# Patient Record
Sex: Female | Born: 1935 | Race: White | Hispanic: No | Smoking: Former smoker
Health system: Southern US, Community
[De-identification: ages and names within clinical notes are randomized; demographics above are authoritative.]

## PROBLEM LIST (undated history)

## (undated) DIAGNOSIS — I499 Cardiac arrhythmia, unspecified: Secondary | ICD-10-CM

## (undated) DIAGNOSIS — G4730 Sleep apnea, unspecified: Secondary | ICD-10-CM

## (undated) DIAGNOSIS — R112 Nausea with vomiting, unspecified: Secondary | ICD-10-CM

## (undated) DIAGNOSIS — T4145XA Adverse effect of unspecified anesthetic, initial encounter: Secondary | ICD-10-CM

## (undated) DIAGNOSIS — C50919 Malignant neoplasm of unspecified site of unspecified female breast: Secondary | ICD-10-CM

## (undated) DIAGNOSIS — T8859XA Other complications of anesthesia, initial encounter: Secondary | ICD-10-CM

## (undated) DIAGNOSIS — T753XXA Motion sickness, initial encounter: Secondary | ICD-10-CM

## (undated) DIAGNOSIS — IMO0001 Reserved for inherently not codable concepts without codable children: Secondary | ICD-10-CM

## (undated) DIAGNOSIS — I10 Essential (primary) hypertension: Secondary | ICD-10-CM

## (undated) DIAGNOSIS — E785 Hyperlipidemia, unspecified: Secondary | ICD-10-CM

## (undated) DIAGNOSIS — G2581 Restless legs syndrome: Secondary | ICD-10-CM

## (undated) DIAGNOSIS — Z972 Presence of dental prosthetic device (complete) (partial): Secondary | ICD-10-CM

## (undated) DIAGNOSIS — C801 Malignant (primary) neoplasm, unspecified: Secondary | ICD-10-CM

## (undated) DIAGNOSIS — Z9889 Other specified postprocedural states: Secondary | ICD-10-CM

## (undated) HISTORY — DX: Malignant (primary) neoplasm, unspecified: C80.1

## (undated) HISTORY — PX: BUNIONECTOMY: SHX129

## (undated) HISTORY — DX: Malignant neoplasm of unspecified site of unspecified female breast: C50.919

## (undated) HISTORY — PX: TONSILLECTOMY: SUR1361

## (undated) HISTORY — DX: Sleep apnea, unspecified: G47.30

## (undated) HISTORY — DX: Essential (primary) hypertension: I10

## (undated) HISTORY — DX: Restless legs syndrome: G25.81

## (undated) HISTORY — DX: Hyperlipidemia, unspecified: E78.5

---

## 1984-12-31 HISTORY — PX: BREAST SURGERY: SHX581

## 1998-12-31 DIAGNOSIS — C50919 Malignant neoplasm of unspecified site of unspecified female breast: Secondary | ICD-10-CM

## 1998-12-31 HISTORY — PX: BREAST SURGERY: SHX581

## 1998-12-31 HISTORY — PX: MASTECTOMY: SHX3

## 1998-12-31 HISTORY — DX: Malignant neoplasm of unspecified site of unspecified female breast: C50.919

## 2007-10-10 ENCOUNTER — Ambulatory Visit: Payer: Self-pay | Admitting: Internal Medicine

## 2008-10-13 ENCOUNTER — Ambulatory Visit: Payer: Self-pay | Admitting: Internal Medicine

## 2009-10-17 ENCOUNTER — Ambulatory Visit: Payer: Self-pay | Admitting: Internal Medicine

## 2009-12-28 ENCOUNTER — Encounter: Payer: Self-pay | Admitting: Rheumatology

## 2009-12-31 ENCOUNTER — Encounter: Payer: Self-pay | Admitting: Rheumatology

## 2010-01-31 ENCOUNTER — Encounter: Payer: Self-pay | Admitting: Rheumatology

## 2010-10-31 ENCOUNTER — Ambulatory Visit: Payer: Self-pay | Admitting: Internal Medicine

## 2010-11-22 ENCOUNTER — Ambulatory Visit: Payer: Self-pay | Admitting: Internal Medicine

## 2011-08-25 ENCOUNTER — Emergency Department: Payer: Self-pay | Admitting: *Deleted

## 2011-11-29 ENCOUNTER — Ambulatory Visit: Payer: Self-pay | Admitting: Internal Medicine

## 2012-12-08 ENCOUNTER — Ambulatory Visit: Payer: Self-pay | Admitting: Internal Medicine

## 2013-11-30 DIAGNOSIS — C50919 Malignant neoplasm of unspecified site of unspecified female breast: Secondary | ICD-10-CM

## 2013-11-30 HISTORY — DX: Malignant neoplasm of unspecified site of unspecified female breast: C50.919

## 2013-11-30 HISTORY — PX: BREAST BIOPSY: SHX20

## 2013-12-09 ENCOUNTER — Ambulatory Visit: Payer: Self-pay | Admitting: Internal Medicine

## 2013-12-14 ENCOUNTER — Ambulatory Visit: Payer: Self-pay | Admitting: Internal Medicine

## 2013-12-21 ENCOUNTER — Ambulatory Visit: Payer: Self-pay | Admitting: Internal Medicine

## 2013-12-30 ENCOUNTER — Encounter: Payer: Self-pay | Admitting: General Surgery

## 2013-12-30 ENCOUNTER — Ambulatory Visit (INDEPENDENT_AMBULATORY_CARE_PROVIDER_SITE_OTHER): Payer: Medicare Other | Admitting: General Surgery

## 2013-12-30 VITALS — BP 130/80 | HR 68 | Resp 14 | Ht 59.0 in | Wt 181.0 lb

## 2013-12-30 DIAGNOSIS — D0590 Unspecified type of carcinoma in situ of unspecified breast: Secondary | ICD-10-CM

## 2013-12-30 DIAGNOSIS — D0511 Intraductal carcinoma in situ of right breast: Secondary | ICD-10-CM

## 2013-12-30 NOTE — Patient Instructions (Addendum)
Call us with your decision regarding your surgery choice.

## 2013-12-30 NOTE — Progress Notes (Addendum)
Patient ID: Kathleen Baker, female   DOB: Aug 18, 1925, 79 y.o.   MRN: [id number]  Chief Complaint  Patient presents with  . Other    mammogram    HPI Kathleen Baker is a 79 y.o. female here today for an breast evaluation. Patient had her mammogram on 12/14/13 at [facility] and right breast biopsy done on 12/21/13 with diagnosis of ductal carcinoma in situ. Patient does perform self breast checks and gets regular mammograms. She has a history of breast cancer with left mastectomy and history of right lumpectomy. HPI  Past Medical History  Diagnosis Date  . Sleep apnea   . Hypertension   . Hyperlipidemia   . Restless leg     Past Surgical History  Procedure Laterality Date  . Tonsillectomy    . Bunionectomy    . Breast surgery Left 2000    mastectomy  . Breast surgery Right 1986    LUMPECTOMY with reexcision  . Breast biopsy Right 11/2013    No family history on file.  Social History History  Substance Use Topics  . Smoking status: Former Smoker    Types: Cigarettes    Quit date: 12/31/1992  . Smokeless tobacco: Never Used  . Alcohol Use: No    Allergies  Allergen Reactions  . Morphine And Related     Current Outpatient Prescriptions  Medication Sig Dispense Refill  . amLODipine (NORVASC) 5 MG tablet Take 1 tablet by mouth daily.      Marland Kitchen aspirin 81 MG tablet Take 81 mg by mouth daily.      Marland Kitchen atorvastatin (LIPITOR) 40 MG tablet Take 1 tablet by mouth daily at 6 (six) AM.      . benazepril (LOTENSIN) 10 MG tablet Take 1 tablet by mouth daily.      . cephALEXin (KEFLEX) 500 MG capsule Take 500 mg by mouth 4 (four) times daily.      . clonazePAM (KLONOPIN) 1 MG tablet Take 1 tablet by mouth daily.       No current facility-administered medications for this visit.    Review of Systems Review of Systems  Constitutional: Negative.   Respiratory: Positive for shortness of breath. Negative for apnea, cough, choking, chest tightness, wheezing and stridor.    Cardiovascular: Negative.     Blood pressure 130/80, pulse 68, resp. rate 14, height 4\' 11"  (1.499 m), weight 181 lb (82.101 kg).  Physical Exam Physical Exam  Constitutional: She is oriented to person, place, and time. She appears well-developed and well-nourished.  Eyes: Conjunctivae are normal. No scleral icterus.  Neck: Neck supple.  Cardiovascular: Normal rate, regular rhythm and normal heart sounds.   Pulmonary/Chest: Effort normal and breath sounds normal. Right breast exhibits no inverted nipple, no mass, no nipple discharge, no skin change and no tenderness.  Left chest wall and axilla good. Right breast with bruising at 2 and 9 o'clock.  Lymphadenopathy:    She has no cervical adenopathy.  Neurological: She is alert and oriented to person, place, and time.    Data Reviewed Radiology report from December 09, 2013 through December 21, 2013 were reviewed. Images were reviewed. Biopsy of the right breast showed a single foci of DCIS measuring 0.4 cm in length. The mammogram reported a 3.9 cm area of diffuse calcifications.  Assessment    DCIS right breast.  Status post left mastectomy for benign disease.  Past history of breast excision x2 without clear diagnosis of malignancy. No treatment with radiation therapy or antiestrogen therapy.  Plan    Options for management were reviewed: 1) wide excision with or without postoperative radiation therapy versus 2) simple mastectomy. The pros and cons of each were reviewed. The patient reported that she had had her left mastectomy as she did not want to have multiple imaging studies in the future, but was troubled by the need for a drain postoperatively. She was accompanied today by her niece, Al Pimple, who was present for the interview and exam. They'll consider their options, and notify the office of how they would like to proceed. By report, the patient has an appointment with the multidisciplinary breast clinic on  January 05, 2014. I'm not quite sure will what this will achieve for the patient, but she was encouraged to follow through with that appointment if she desired.        Earline Mayotte 01/01/2014, 1:04 PM

## 2014-01-01 ENCOUNTER — Encounter: Payer: Self-pay | Admitting: General Surgery

## 2014-01-01 DIAGNOSIS — D0510 Intraductal carcinoma in situ of unspecified breast: Secondary | ICD-10-CM | POA: Insufficient documentation

## 2014-01-04 ENCOUNTER — Telehealth: Payer: Self-pay | Admitting: *Deleted

## 2014-01-04 ENCOUNTER — Other Ambulatory Visit: Payer: Self-pay | Admitting: General Surgery

## 2014-01-04 DIAGNOSIS — D0511 Intraductal carcinoma in situ of right breast: Secondary | ICD-10-CM

## 2014-01-04 NOTE — Telephone Encounter (Signed)
Message for patient to call the office.   We can go ahead and arrange for her right breast wide excision with needle loc.   Appointment to meet with the multidisciplinary clinic for 01-05-14 has been cancelled. Tanya Nones, RN notified accordingly.

## 2014-01-04 NOTE — Telephone Encounter (Signed)
Patient's surgery has been scheduled for 01-13-14 at [facility]. This patient and her niece, Elba Barman, are aware of instructions. It is okay for patient to continue 81 mg aspirin.  This patient is also aware that we have cancelled appointment at the [facility] for tomorrow.   Patient and her niece were instructed to call the office should they have further questions.   Patient reports her last right breast lumpectomy was completed by Dr. Marylene Buerger in [place].

## 2014-01-05 LAB — PATHOLOGY REPORT

## 2014-01-06 ENCOUNTER — Ambulatory Visit: Payer: Self-pay | Admitting: General Surgery

## 2014-01-06 DIAGNOSIS — I10 Essential (primary) hypertension: Secondary | ICD-10-CM

## 2014-01-06 LAB — CBC WITH DIFFERENTIAL/PLATELET
Basophil #: 0 10*3/uL (ref 0.0–0.1)
Basophil %: 0.3 %
EOS PCT: 1.1 %
Eosinophil #: 0.1 10*3/uL (ref 0.0–0.7)
HCT: 39.4 % (ref 35.0–47.0)
HGB: 13.2 g/dL (ref 12.0–16.0)
Lymphocyte #: 1.8 10*3/uL (ref 1.0–3.6)
Lymphocyte %: 30.5 %
MCH: 31.8 pg (ref 26.0–34.0)
MCHC: 33.6 g/dL (ref 32.0–36.0)
MCV: 95 fL (ref 80–100)
Monocyte #: 0.6 x10 3/mm (ref 0.2–0.9)
Monocyte %: 9.6 %
Neutrophil #: 3.4 10*3/uL (ref 1.4–6.5)
Neutrophil %: 58.5 %
Platelet: 144 10*3/uL — ABNORMAL LOW (ref 150–440)
RBC: 4.16 10*6/uL (ref 3.80–5.20)
RDW: 14.6 % — ABNORMAL HIGH (ref 11.5–14.5)
WBC: 5.8 10*3/uL (ref 3.6–11.0)

## 2014-01-06 LAB — BASIC METABOLIC PANEL
Anion Gap: 4 — ABNORMAL LOW (ref 7–16)
BUN: 21 mg/dL — ABNORMAL HIGH (ref 7–18)
CHLORIDE: 109 mmol/L — AB (ref 98–107)
Calcium, Total: 9.1 mg/dL (ref 8.5–10.1)
Co2: 28 mmol/L (ref 21–32)
Creatinine: 1.02 mg/dL (ref 0.60–1.30)
GFR CALC AF AMER: 58 — AB
GFR CALC NON AF AMER: 50 — AB
GLUCOSE: 77 mg/dL (ref 65–99)
Osmolality: 283 (ref 275–301)
POTASSIUM: 4 mmol/L (ref 3.5–5.1)
Sodium: 141 mmol/L (ref 136–145)

## 2014-01-07 ENCOUNTER — Encounter: Payer: Self-pay | Admitting: General Surgery

## 2014-01-13 ENCOUNTER — Encounter: Payer: Self-pay | Admitting: General Surgery

## 2014-01-13 ENCOUNTER — Ambulatory Visit: Payer: Self-pay | Admitting: General Surgery

## 2014-01-13 DIAGNOSIS — C50219 Malignant neoplasm of upper-inner quadrant of unspecified female breast: Secondary | ICD-10-CM

## 2014-01-13 HISTORY — PX: BREAST SURGERY: SHX581

## 2014-01-14 LAB — PATHOLOGY REPORT

## 2014-01-15 ENCOUNTER — Telehealth: Payer: Self-pay | Admitting: General Surgery

## 2014-01-15 ENCOUNTER — Encounter: Payer: Self-pay | Admitting: General Surgery

## 2014-01-15 NOTE — Telephone Encounter (Signed)
Message left that pathology report was fine, all margins clear. Patient has a f/u appt scheduled for next week.

## 2014-01-20 ENCOUNTER — Ambulatory Visit (INDEPENDENT_AMBULATORY_CARE_PROVIDER_SITE_OTHER): Payer: Medicare Other | Admitting: General Surgery

## 2014-01-20 ENCOUNTER — Encounter: Payer: Self-pay | Admitting: General Surgery

## 2014-01-20 VITALS — BP 130/72 | HR 74 | Resp 14 | Ht 59.0 in | Wt 182.0 lb

## 2014-01-20 DIAGNOSIS — C50919 Malignant neoplasm of unspecified site of unspecified female breast: Secondary | ICD-10-CM

## 2014-01-20 MED ORDER — TAMOXIFEN CITRATE 20 MG PO TABS: 20.0000 mg | ORAL_TABLET | Freq: Every day | ORAL | Status: DC

## 2014-01-20 NOTE — Progress Notes (Signed)
Patient ID: Kathleen Baker, female   DOB: 1925/02/22, 80 y.o.   MRN: [id number]  Chief Complaint  Patient presents with  . Routine Post Op    right breast wide excision    HPI Kathleen Baker is a 80 y.o. female here today for her post op right breast wide excision done on 01/13/14. Patient states she is doing well.  HPI  Past Medical History  Diagnosis Date  . Sleep apnea   . Hypertension   . Hyperlipidemia   . Restless leg   . Cancer   . Malignant neoplasm of breast (female), unspecified site December 2014    DCIS, right breast. ER 90%, PR 90%    Past Surgical History  Procedure Laterality Date  . Tonsillectomy    . Bunionectomy    . Breast surgery Left 2000    mastectomy, completed at [facility] in [place], [place].  . Breast surgery Right 1986    LUMPECTOMY with reexcision. Completed in [facility]  . Breast biopsy Right 11/2013  . Breast surgery Right 01/13/14    right breast wide excision    No family history on file.  Social History History  Substance Use Topics  . Smoking status: Former Smoker    Types: Cigarettes    Quit date: 12/31/1992  . Smokeless tobacco: Never Used  . Alcohol Use: No    Allergies  Allergen Reactions  . Morphine And Related     Current Outpatient Prescriptions  Medication Sig Dispense Refill  . amLODipine (NORVASC) 5 MG tablet Take 1 tablet by mouth daily.      Marland Kitchen aspirin 81 MG tablet Take 81 mg by mouth daily.      Marland Kitchen atorvastatin (LIPITOR) 40 MG tablet Take 1 tablet by mouth daily at 6 (six) AM.      . benazepril (LOTENSIN) 10 MG tablet Take 1 tablet by mouth daily.      . cephALEXin (KEFLEX) 500 MG capsule Take 500 mg by mouth 4 (four) times daily.      . clonazePAM (KLONOPIN) 1 MG tablet Take 1 tablet by mouth daily.      . traMADol (ULTRAM) 50 MG tablet Take 1 tablet by mouth as needed.      . tamoxifen (NOLVADEX) 20 MG tablet Take 1 tablet (20 mg total) by mouth daily.  30 tablet  12   No current  facility-administered medications for this visit.    Review of Systems Review of Systems  Constitutional: Negative.   Respiratory: Negative.   Cardiovascular: Negative.     Blood pressure 130/72, pulse 74, resp. rate 14, height 4\' 11"  (1.499 m), weight 182 lb (82.555 kg).  Physical Exam Physical Exam  Constitutional: She is oriented to person, place, and time. She appears well-developed and well-nourished.  Pulmonary/Chest:  Right breast incision looks clean and healing well.  Minimal bruising noted.   Neurological: She is alert and oriented to person, place, and time.  Skin: Skin is warm and dry.    Data Reviewed  DCIS, ER 90%, PR 90%. Wide excision completed 01/13/2014 showed negative margins. Ptosis margin, 10 mm. High-grade.  Assessment    DCIS right breast.    Plan    The case had been reviewed informally with radiation oncology. There was not felt to be strong and affect from standard post wide excision radiation therapy. The patient was offered the opportunity to meet with radiation oncology, but deferred.  She made use of this to with her last malignancy  treated by mastectomy. With the findings of high-grade DCIS and lower cost, we'll make use of a trial of tamoxifen 20 mg daily. She will continue to make use of a daily pediatric aspirin. The risks associated with tamoxifen therapy including those of DVT/PE; uterine bleeding/malignancy and vasomotor instability were reviewed. He was asked to get a phone reported one month of her tolerance of the medication.  We'll plan for a follow up exam and right mammogram in 5 months       Kathleen Baker 01/21/2014, 4:07 PM

## 2014-01-20 NOTE — Patient Instructions (Addendum)
The patient is aware that a heating pad may be used for comfort as needed. . Discusses Tamoxifen.Call us at the end of February and let us know how the Tamoxifen is working. Return in June 2015

## 2014-01-21 DIAGNOSIS — C50919 Malignant neoplasm of unspecified site of unspecified female breast: Secondary | ICD-10-CM | POA: Insufficient documentation

## 2014-02-01 ENCOUNTER — Encounter: Payer: Self-pay | Admitting: General Surgery

## 2014-03-01 ENCOUNTER — Telehealth: Payer: Self-pay | Admitting: *Deleted

## 2014-03-01 NOTE — Telephone Encounter (Signed)
Patient called in on Friday February 27,2015 and left an message with the answering to call her back .Called patient today, She states she is having no problems with the Tamoxifen. Patient states no hot flashes or any problems with her right breast.

## 2014-06-08 ENCOUNTER — Encounter: Payer: Self-pay | Admitting: General Surgery

## 2014-06-08 ENCOUNTER — Ambulatory Visit: Payer: Self-pay | Admitting: General Surgery

## 2014-06-21 ENCOUNTER — Encounter: Payer: Self-pay | Admitting: General Surgery

## 2014-06-21 ENCOUNTER — Ambulatory Visit (INDEPENDENT_AMBULATORY_CARE_PROVIDER_SITE_OTHER): Payer: Medicare Other | Admitting: General Surgery

## 2014-06-21 VITALS — BP 132/72 | HR 70 | Resp 14 | Ht 59.0 in | Wt 181.0 lb

## 2014-06-21 DIAGNOSIS — D0590 Unspecified type of carcinoma in situ of unspecified breast: Secondary | ICD-10-CM

## 2014-06-21 DIAGNOSIS — D0511 Intraductal carcinoma in situ of right breast: Secondary | ICD-10-CM

## 2014-06-21 DIAGNOSIS — Z853 Personal history of malignant neoplasm of breast: Secondary | ICD-10-CM

## 2014-06-21 NOTE — Progress Notes (Signed)
Patient ID: Kathleen Baker, female   DOB: Jan 14, 1925, 80 y.o.   MRN: [id number]  Chief Complaint  Patient presents with  . Follow-up    mammogram    HPI Kathleen Baker is a 80 y.o. female who presents for a breast evaluation. The most recent mammogram was done on 06/08/14. The patient underwent wide excision in December 2014. The patient had previously undergone wide excision of the right breast in 1986, and post excision radiation therapy had not been felt mandatory on discussion with radiation oncology. The patient reports tolerating her tamoxifen therapy without ill effect. Patient does perform regular self breast checks and gets regular mammograms done.    HPI  Past Medical History  Diagnosis Date  . Sleep apnea   . Hypertension   . Hyperlipidemia   . Restless leg   . Cancer   . Malignant neoplasm of breast (female), unspecified site December 2014    DCIS, right breast. ER 90%, PR 90%    Past Surgical History  Procedure Laterality Date  . Tonsillectomy    . Bunionectomy    . Breast surgery Left 2000    mastectomy, completed at [facility] in [place], [place].  . Breast surgery Right 1986    LUMPECTOMY with reexcision. Completed in [facility]  . Breast biopsy Right 11/2013  . Breast surgery Right 01/13/14    right breast wide excision    No family history on file.  Social History History  Substance Use Topics  . Smoking status: Former Smoker    Types: Cigarettes    Quit date: 12/31/1992  . Smokeless tobacco: Never Used  . Alcohol Use: No    Allergies  Allergen Reactions  . Morphine And Related     Current Outpatient Prescriptions  Medication Sig Dispense Refill  . amLODipine (NORVASC) 5 MG tablet Take 1 tablet by mouth daily.      Marland Kitchen aspirin 81 MG tablet Take 81 mg by mouth daily.      Marland Kitchen atorvastatin (LIPITOR) 40 MG tablet Take 1 tablet by mouth daily at 6 (six) AM.      . benazepril (LOTENSIN) 10 MG tablet Take 1 tablet by mouth  daily.      . cephALEXin (KEFLEX) 500 MG capsule Take 500 mg by mouth 4 (four) times daily.      . clonazePAM (KLONOPIN) 1 MG tablet Take 1 tablet by mouth daily.      . tamoxifen (NOLVADEX) 20 MG tablet Take 1 tablet (20 mg total) by mouth daily.  30 tablet  12  . traMADol (ULTRAM) 50 MG tablet Take 1 tablet by mouth as needed.       No current facility-administered medications for this visit.    Review of Systems Review of Systems  Constitutional: Negative.   Respiratory: Negative.   Cardiovascular: Negative.     Blood pressure 132/72, pulse 70, resp. rate 14, height 4\' 11"  (1.499 m), weight 181 lb (82.101 kg).  Physical Exam Physical Exam  Constitutional: She is oriented to person, place, and time. She appears well-developed and well-nourished.  Eyes: Conjunctivae are normal. No scleral icterus.  Neck: Neck supple.  Cardiovascular: Normal rate, regular rhythm and normal heart sounds.   Pulmonary/Chest: Effort normal and breath sounds normal. Right breast exhibits no inverted nipple, no mass, no nipple discharge, no skin change and no tenderness.    Left chest wall mastectomy incision is well-healed.   Lymphadenopathy:    She has no cervical adenopathy.  She has no axillary adenopathy.  Neurological: She is alert and oriented to person, place, and time.  Skin: Skin is warm and dry.    Data Reviewed Right breast mammogram dated 06/08/2014 showed benign surgical changes. BI-RAD-2. One year followup recommended.  Assessment    Doing well now 6 months status post wide excision. The tolerance of antiestrogen therapy.    Plan    All the radiologist recommended a one-year followup, I've asked the patient have a follow up right breast mammogram in 6 months. Assuming no interval change she'll go to yearly screening see him in the office and this will coordinate mammograms and office visits here. This was acceptable to the patient.    PCP: Vanetta Shawl 06/21/2014, 8:15 PM

## 2014-06-21 NOTE — Patient Instructions (Signed)
Patient to return in 6 months right breast diagnotic mammogram.

## 2014-11-01 ENCOUNTER — Encounter: Payer: Self-pay | Admitting: General Surgery

## 2014-12-13 ENCOUNTER — Ambulatory Visit: Payer: Self-pay | Admitting: General Surgery

## 2014-12-13 ENCOUNTER — Encounter: Payer: Self-pay | Admitting: General Surgery

## 2014-12-20 ENCOUNTER — Encounter: Payer: Self-pay | Admitting: General Surgery

## 2014-12-20 ENCOUNTER — Ambulatory Visit (INDEPENDENT_AMBULATORY_CARE_PROVIDER_SITE_OTHER): Payer: Medicare Other | Admitting: General Surgery

## 2014-12-20 VITALS — BP 122/74 | HR 72 | Resp 14 | Ht 60.0 in | Wt 163.0 lb

## 2014-12-20 DIAGNOSIS — Z853 Personal history of malignant neoplasm of breast: Secondary | ICD-10-CM

## 2014-12-20 DIAGNOSIS — D0511 Intraductal carcinoma in situ of right breast: Secondary | ICD-10-CM

## 2014-12-20 NOTE — Progress Notes (Signed)
Patient ID: Kathleen Baker, female   DOB: [birth year], 80 y.o.   MRN: [id number]  Chief Complaint  Patient presents with  . Follow-up    mammmogram    HPI Kathleen Baker is a 80 y.o. female who presents for a breast evaluation. The most recent right breast mammogram was done on 12/13/14 Patient does perform regular self breast checks and gets regular mammograms done.    HPI  Past Medical History  Diagnosis Date  . Sleep apnea   . Hypertension   . Hyperlipidemia   . Restless leg   . Cancer   . Malignant neoplasm of breast (female), unspecified site December 2014    DCIS, right breast. ER 90%, PR 90%    Past Surgical History  Procedure Laterality Date  . Tonsillectomy    . Bunionectomy    . Breast surgery Left 2000    mastectomy, completed at [facility] in [place], [place].  . Breast surgery Right 1986    LUMPECTOMY with reexcision. Completed in [facility]  . Breast biopsy Right 11/2013  . Breast surgery Right 01/13/14    right breast wide excision    No family history on file.  Social History History  Substance Use Topics  . Smoking status: Former Smoker    Types: Cigarettes    Quit date: 12/31/1992  . Smokeless tobacco: Never Used  . Alcohol Use: No    Allergies  Allergen Reactions  . Morphine And Related     Current Outpatient Prescriptions  Medication Sig Dispense Refill  . amLODipine (NORVASC) 5 MG tablet Take 1 tablet by mouth daily.    Marland Kitchen aspirin 81 MG tablet Take 81 mg by mouth daily.    Marland Kitchen atorvastatin (LIPITOR) 40 MG tablet Take 1 tablet by mouth daily at 6 (six) AM.    . benazepril (LOTENSIN) 10 MG tablet Take 1 tablet by mouth daily.    . clonazePAM (KLONOPIN) 1 MG tablet Take 1 tablet by mouth daily.    . tamoxifen (NOLVADEX) 20 MG tablet Take 1 tablet (20 mg total) by mouth daily. 30 tablet 12  . cephALEXin (KEFLEX) 500 MG capsule Take 500 mg by mouth 4 (four) times daily.    . traMADol (ULTRAM) 50 MG  tablet Take 1 tablet by mouth as needed.     No current facility-administered medications for this visit.    Review of Systems Review of Systems  Constitutional: Negative.   Respiratory: Negative.   Cardiovascular: Negative.     Blood pressure 122/74, pulse 72, resp. rate 14, height 5' (1.524 m), weight 163 lb (73.936 kg).  Physical Exam Physical Exam  Constitutional: She is oriented to person, place, and time. She appears well-developed and well-nourished.  Eyes: Conjunctivae are normal. No scleral icterus.  Neck: Neck supple.  Cardiovascular: Normal rate, regular rhythm and normal heart sounds.   Pulmonary/Chest: Effort normal and breath sounds normal. Right breast exhibits no inverted nipple, no mass, no nipple discharge, no skin change and no tenderness.  Volume lost in right breast welled healed incision.left mastectomy site well healed incision.   Lymphadenopathy:    She has no cervical adenopathy.    She has no axillary adenopathy.  Neurological: She is alert and oriented to person, place, and time.  Skin: Skin is warm and dry.    Data Reviewed Right breast mammogram dated 12/13/2014 was reviewed. Postsurgical changes are identified.  BI-RADS-2.  Assessment    Doing well status post excision right breast DCIS, good tolerance  of tamoxifen therapy.    Plan    The patient has been asked to return to the office in one year with a right diagnostic mammogram.     PCP:  Vanetta Shawl 12/20/2014, 8:35 PM

## 2014-12-20 NOTE — Patient Instructions (Signed)
The patient has been asked to return to the office in one year with a right diagnostic mammogram. 

## 2015-01-11 IMAGING — CR MM BREAST BX W/ LOC DEV 1ST LESION IMAGE BX SPEC STEREO GUIDE*R*
4 of 5 series · 6 of 8 positions shown · non-contrast
Comparison: Previous exams.

ADDENDUM:
Pathologic results indicate ductal carcinoma in situ which is
concordant with the imaging findings. The patient reports doing well
after the biopsy. I spoke with the patient by telephone on
12/22/2013 at [DATE] p.m.. The patient will contact her referring
physician's office by the end of the day tomorrow to establish
surgical referral if she has not already heard from her referring
physician's office. I did telephone the referring physician's office
with this information.
CLINICAL DATA: Calcifications right breast for biopsy

EXAM:
Right STEREOTACTIC CORE NEEDLE BIOPSY

[ML (1 of 4)]
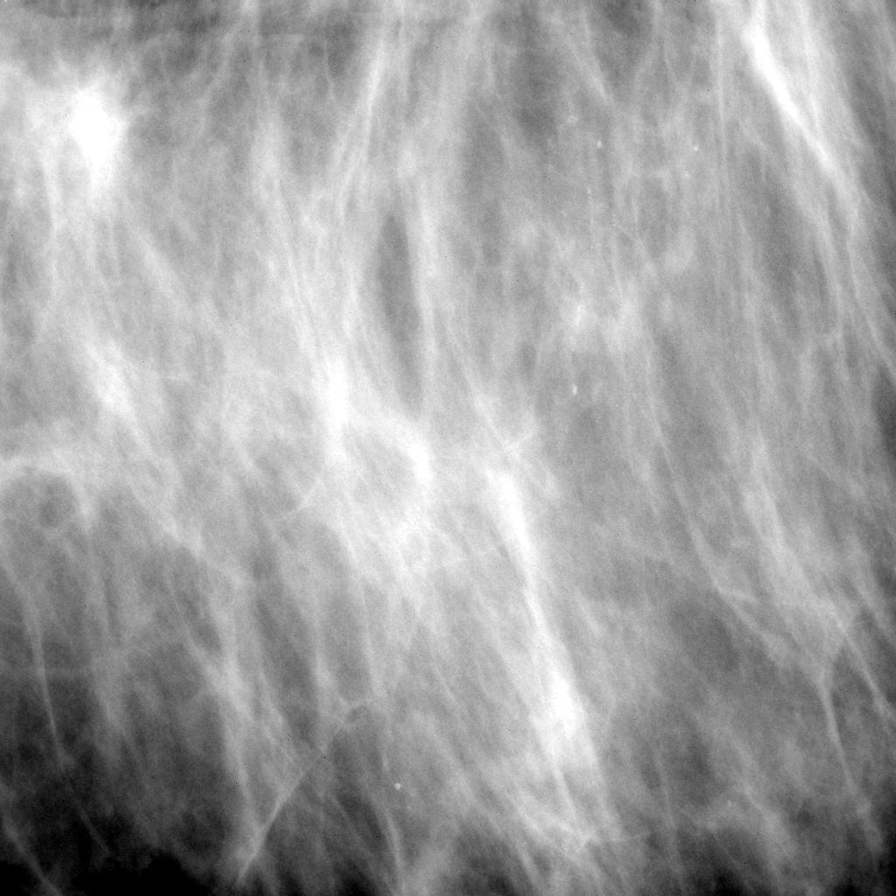

[Series 1: ML · right · 2 of 2 slices shown (2 of 4)]
[im 1/2]
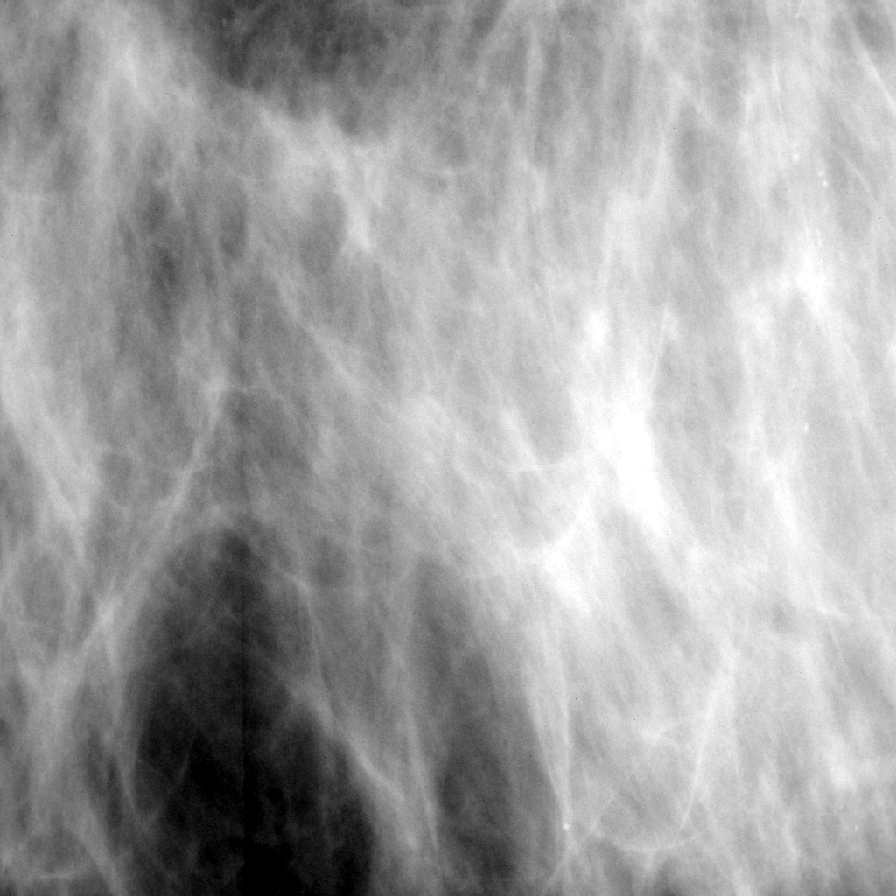
[im 2/2]
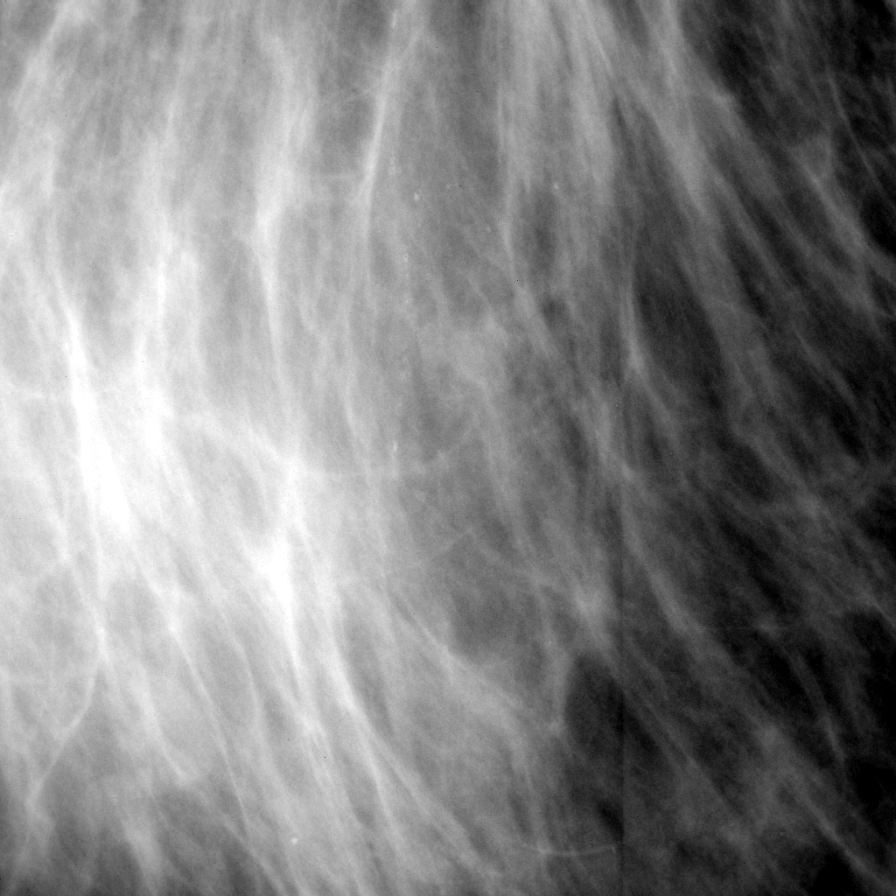

[Series 2: ML · right · 2 of 2 slices shown (3 of 4)]
[im 1/2]
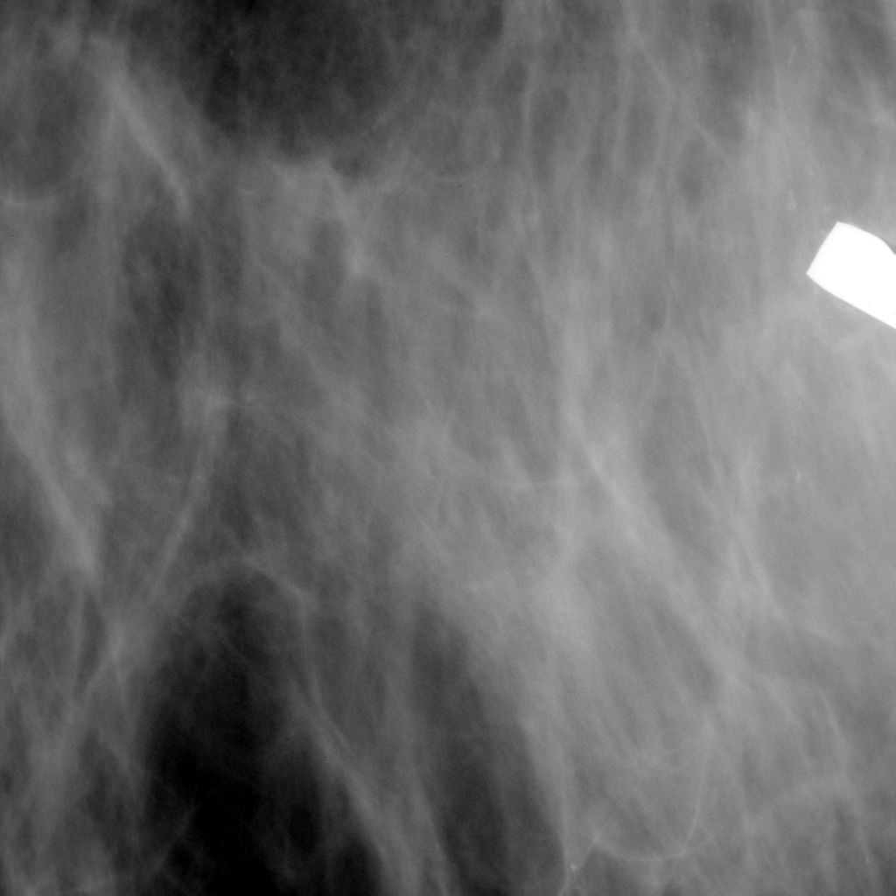
[im 2/2]
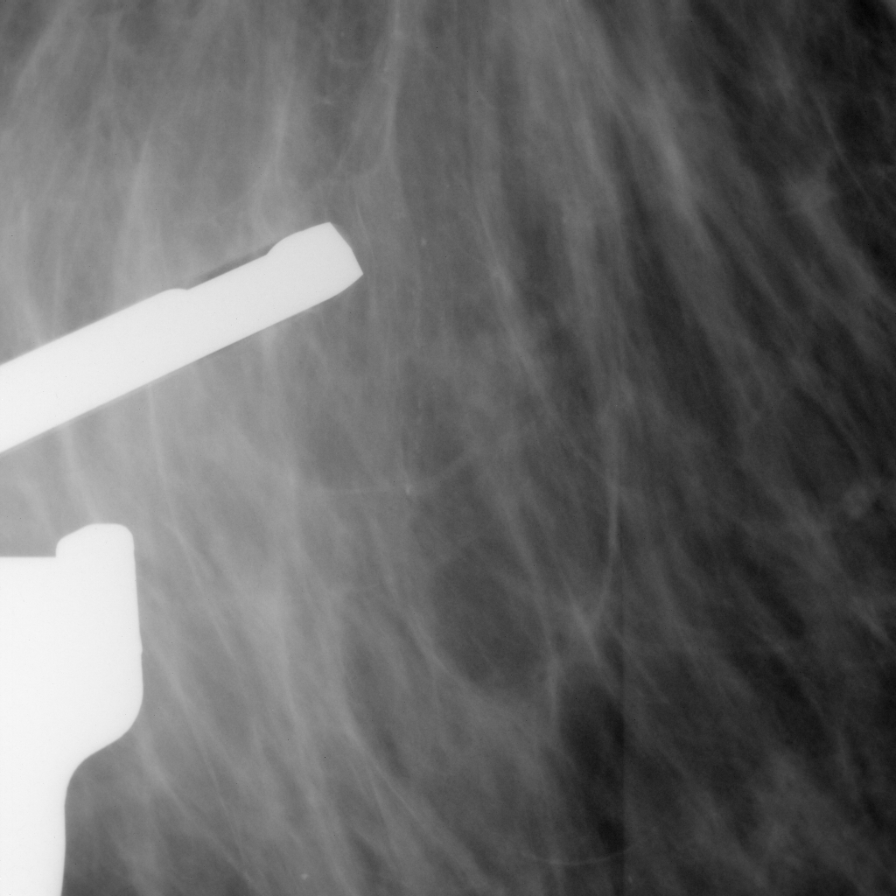

[ML (4 of 4)]
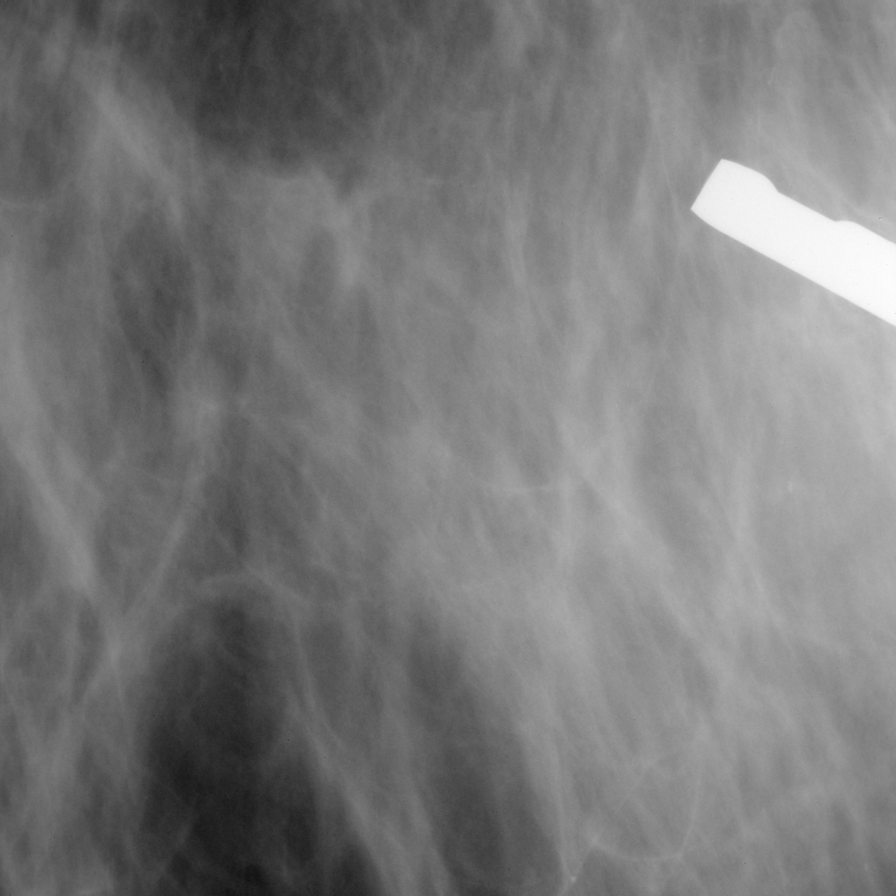

[6 of 8 positions shown; findings below may reference images not displayed]



Using sterile technique and 1% Lidocaine as local anesthetic, under
stereotactic guidance, a 9 gauge vacuum assisted device was used to
perform core needle biopsy of calcifications in the medial right
breast using a medial approach. Specimen radiograph was performed
showing inclusion the calcifications of concern. Specimens with
calcifications are identified for pathology.

At the conclusion of the procedure, a tissue marker clip was
deployed into the biopsy cavity. Follow-up 2-view mammogram
confirmed clip in the area of concern.
IMPRESSION: Stereotactic-guided biopsy of right breast. No apparent
complications.

## 2015-02-16 ENCOUNTER — Telehealth: Payer: Self-pay

## 2015-02-16 NOTE — Telephone Encounter (Signed)
Sorry,  Unless she specifically stated that I accepted her upon request from an established patient or by request from another MD (other than Dr Humphrey Rolls).  I cannot accept her.

## 2015-02-16 NOTE — Telephone Encounter (Signed)
This patient called and is hoping to become a new patient. I explained to her that Dr.Tullo is not taking on new patients, however, she insisted I send a message asking Dr.Tullo.   Do you want her added on your schedule?

## 2015-04-06 ENCOUNTER — Other Ambulatory Visit: Payer: Self-pay | Admitting: General Surgery

## 2015-04-23 NOTE — Op Note (Signed)
PATIENT NAME:  Kathleen Baker, Kathleen Baker MR#:  [id number] DATE OF BIRTH:  07/28/1925  DATE OF PROCEDURE:  01/13/2014  PREOPERATIVE DIAGNOSIS:  Ductal carcinoma in situ, right breast.   POSTOPERATIVE DIAGNOSIS:  Ductal carcinoma in situ, right breast.   OPERATIVE PROCEDURE:  Wide local excision with needle and ultrasound localization, mastoplasty.   OPERATING SURGEON:  Dr. Hervey Ard.   ANESTHESIA:  General by LMA under Dr. Myra Gianotti, Marcaine 0.5% with 1:200,000 units of epinephrine 30 mL local infiltration.   ESTIMATED BLOOD LOSS:  Minimal.   CLINICAL NOTE:  This 81 year old woman recently underwent a stereotactic biopsy of the right breast with findings of DCIS. Given her options for management, she desired breast conservation. She underwent bracket wire placement by Dr. Donavan Burnet the morning of the procedure.   OPERATIVE NOTE:  With the patient under adequate general anesthesia, the breast was carefully prepped with ChloraPrep and draped. Ultrasound was used to confirm the location of the tips of the wires prior to skin incision. Both wires were visualized. Preoperative mammograms were reviewed. The area was infiltrated with Marcaine for postoperative analgesia. A linear incision in the upper inner quadrant of the right breast was made and carried down through the skin and subcutaneous tissue with hemostasis achieved by electrocautery. A 5 x 5 x 4-cm block of tissue including the pectoralis fascia was excised. Specimen radiograph confirmed both wires intact, as well as the previously placed stereotactic clip. The pathologist reported that the old biopsy cavity was contained within the specimen, and no gross extension to the margins was evident.   The breast was elevated off the underlying pectoralis muscle circumferentially for 5 cm, and then approximated with interrupted 2-0 Vicryl figure-of-eight sutures. Multilayer wound closure of the adipose tissue was undertaken. The skin was closed  with a running 4-0 Vicryl subcuticular suture. Benzoin and Steri-Strips, followed by Telfa, fluff gauze, Kerlix, and an Ace wrap, were applied.   The patient tolerated the procedure well and was taken to the recovery room in stable condition.    ____________________________ Robert Bellow, MD jwb:ms D: 01/13/2014 17:19:00 ET T: 01/13/2014 19:32:15 ET JOB#: [id number]  cc: Robert Bellow, MD, <Dictator> Perrin Maltese, MD Edda Orea Amedeo Kinsman MD ELECTRONICALLY SIGNED 01/14/2014 16:22

## 2015-08-10 ENCOUNTER — Encounter: Payer: Self-pay | Admitting: *Deleted

## 2015-08-12 NOTE — Discharge Instructions (Signed)

## 2015-08-15 ENCOUNTER — Ambulatory Visit
Admission: RE | Admit: 2015-08-15 | Discharge: 2015-08-15 | Disposition: A | Discharge status: Home / Self Care | Payer: Medicare Other | Source: Ambulatory Visit | Attending: Ophthalmology | Admitting: Ophthalmology

## 2015-08-15 ENCOUNTER — Ambulatory Visit: Payer: Medicare Other | Admitting: Anesthesiology

## 2015-08-15 ENCOUNTER — Encounter
Admission: RE | Disposition: A | Discharge status: Home / Self Care | Payer: Self-pay | Source: Ambulatory Visit | Attending: Ophthalmology

## 2015-08-15 DIAGNOSIS — Z885 Allergy status to narcotic agent status: Secondary | ICD-10-CM | POA: Diagnosis not present

## 2015-08-15 DIAGNOSIS — Z79899 Other long term (current) drug therapy: Secondary | ICD-10-CM | POA: Insufficient documentation

## 2015-08-15 DIAGNOSIS — Z7982 Long term (current) use of aspirin: Secondary | ICD-10-CM | POA: Diagnosis not present

## 2015-08-15 DIAGNOSIS — Z9889 Other specified postprocedural states: Secondary | ICD-10-CM | POA: Insufficient documentation

## 2015-08-15 DIAGNOSIS — I499 Cardiac arrhythmia, unspecified: Secondary | ICD-10-CM | POA: Diagnosis not present

## 2015-08-15 DIAGNOSIS — E780 Pure hypercholesterolemia: Secondary | ICD-10-CM | POA: Diagnosis not present

## 2015-08-15 DIAGNOSIS — Z9012 Acquired absence of left breast and nipple: Secondary | ICD-10-CM | POA: Insufficient documentation

## 2015-08-15 DIAGNOSIS — Z87891 Personal history of nicotine dependence: Secondary | ICD-10-CM | POA: Diagnosis not present

## 2015-08-15 DIAGNOSIS — Z79810 Long term (current) use of selective estrogen receptor modulators (SERMs): Secondary | ICD-10-CM | POA: Diagnosis not present

## 2015-08-15 DIAGNOSIS — H2512 Age-related nuclear cataract, left eye: Secondary | ICD-10-CM | POA: Insufficient documentation

## 2015-08-15 DIAGNOSIS — Z853 Personal history of malignant neoplasm of breast: Secondary | ICD-10-CM | POA: Insufficient documentation

## 2015-08-15 DIAGNOSIS — I10 Essential (primary) hypertension: Secondary | ICD-10-CM | POA: Diagnosis not present

## 2015-08-15 DIAGNOSIS — H269 Unspecified cataract: Secondary | ICD-10-CM | POA: Diagnosis present

## 2015-08-15 HISTORY — DX: Motion sickness, initial encounter: T75.3XXA

## 2015-08-15 HISTORY — PX: CATARACT EXTRACTION W/PHACO: SHX586

## 2015-08-15 HISTORY — DX: Reserved for inherently not codable concepts without codable children: IMO0001

## 2015-08-15 HISTORY — DX: Nausea with vomiting, unspecified: R11.2

## 2015-08-15 HISTORY — DX: Cardiac arrhythmia, unspecified: I49.9

## 2015-08-15 HISTORY — DX: Other complications of anesthesia, initial encounter: T88.59XA

## 2015-08-15 HISTORY — DX: Presence of dental prosthetic device (complete) (partial): Z97.2

## 2015-08-15 HISTORY — DX: Nausea with vomiting, unspecified: Z98.890

## 2015-08-15 HISTORY — DX: Adverse effect of unspecified anesthetic, initial encounter: T41.45XA

## 2015-08-15 SURGERY — PHACOEMULSIFICATION, CATARACT, WITH IOL INSERTION
Anesthesia: Monitor Anesthesia Care | Laterality: Left | Wound class: Clean

## 2015-08-15 MED ORDER — BSS IO SOLN
INTRAOCULAR | Status: DC | PRN
  Administered 2015-08-15: 105 mL via OPHTHALMIC

## 2015-08-15 MED ORDER — CEFUROXIME OPHTHALMIC INJECTION 1 MG/0.1 ML
INJECTION | OPHTHALMIC | Status: DC | PRN
  Administered 2015-08-15: 0.1 mL via OPHTHALMIC

## 2015-08-15 MED ORDER — LACTATED RINGERS IV SOLN: INTRAVENOUS | Status: DC

## 2015-08-15 MED ORDER — ONDANSETRON HCL 4 MG/2ML IJ SOLN
INTRAMUSCULAR | Status: DC | PRN
  Administered 2015-08-15: 4 mg via INTRAVENOUS

## 2015-08-15 MED ORDER — NA HYALUR & NA CHOND-NA HYALUR 0.55-0.5 ML IO KIT
PACK | INTRAOCULAR | Status: DC | PRN
  Administered 2015-08-15: 1 mL via INTRAOCULAR

## 2015-08-15 MED ORDER — BRIMONIDINE TARTRATE 0.2 % OP SOLN
OPHTHALMIC | Status: DC | PRN
  Administered 2015-08-15: 1 [drp] via OPHTHALMIC

## 2015-08-15 MED ORDER — TIMOLOL MALEATE 0.5 % OP SOLN
OPHTHALMIC | Status: DC | PRN
  Administered 2015-08-15: 1 [drp] via OPHTHALMIC

## 2015-08-15 MED ORDER — ACETAMINOPHEN 325 MG PO TABS: 325.0000 mg | ORAL_TABLET | ORAL | Status: DC | PRN

## 2015-08-15 MED ORDER — POVIDONE-IODINE 5 % OP SOLN
1.0000 "application " | OPHTHALMIC | Status: DC | PRN
  Administered 2015-08-15 (×2): 1 via OPHTHALMIC

## 2015-08-15 MED ORDER — ARMC OPHTHALMIC DILATING GEL
1.0000 "application " | OPHTHALMIC | Status: DC | PRN
  Administered 2015-08-15 (×2): 1 via OPHTHALMIC

## 2015-08-15 MED ORDER — MIDAZOLAM HCL 2 MG/2ML IJ SOLN
INTRAMUSCULAR | Status: DC | PRN
  Administered 2015-08-15 (×2): 1 mg via INTRAVENOUS

## 2015-08-15 MED ORDER — TETRACAINE HCL 0.5 % OP SOLN
1.0000 [drp] | OPHTHALMIC | Status: DC | PRN
  Administered 2015-08-15: 1 [drp] via OPHTHALMIC

## 2015-08-15 MED ORDER — ACETAMINOPHEN 160 MG/5ML PO SOLN: 325.0000 mg | ORAL | Status: DC | PRN

## 2015-08-15 MED ORDER — LIDOCAINE HCL (PF) 4 % IJ SOLN
INTRAOCULAR | Status: DC | PRN
  Administered 2015-08-15: 1 mL via OPHTHALMIC

## 2015-08-15 SURGICAL SUPPLY — 29 items
APPLICATOR COTTON TIP 3IN (MISCELLANEOUS) ×2 IMPLANT
CANNULA ANT/CHMB 27GA (MISCELLANEOUS) ×2 IMPLANT
DISSECTOR HYDRO NUCLEUS 50X22 (MISCELLANEOUS) ×2 IMPLANT
GLOVE BIO SURGEON STRL SZ7 (GLOVE) ×2 IMPLANT
GLOVE SURG LX 6.5 MICRO (GLOVE) ×1
GLOVE SURG LX STRL 6.5 MICRO (GLOVE) ×1 IMPLANT
GOWN STRL REUS W/ TWL LRG LVL3 (GOWN DISPOSABLE) ×2 IMPLANT
GOWN STRL REUS W/TWL LRG LVL3 (GOWN DISPOSABLE) ×2
LENS IOL ACRSF IQ PC 24.0 (Intraocular Lens) ×1 IMPLANT
LENS IOL ACRYSOF IQ POST 24.0 (Intraocular Lens) ×2 IMPLANT
MARKER SKIN SURG W/RULER VIO (MISCELLANEOUS) ×2 IMPLANT
NEEDLE FILTER BLUNT 18X 1/2SAF (NEEDLE) ×1
NEEDLE FILTER BLUNT 18X1 1/2 (NEEDLE) ×1 IMPLANT
PACK CATARACT BRASINGTON (MISCELLANEOUS) ×2 IMPLANT
PACK EYE AFTER SURG (MISCELLANEOUS) ×2 IMPLANT
PACK OPTHALMIC (MISCELLANEOUS) ×2 IMPLANT
RING MALYGIN 7.0 (MISCELLANEOUS) IMPLANT
SOL BAL SALT 15ML (MISCELLANEOUS)
SOLUTION BAL SALT 15ML (MISCELLANEOUS) IMPLANT
SUT ETHILON 10-0 CS-B-6CS-B-6 (SUTURE)
SUT VICRYL  9 0 (SUTURE)
SUT VICRYL 9 0 (SUTURE) IMPLANT
SUTURE EHLN 10-0 CS-B-6CS-B-6 (SUTURE) IMPLANT
SYR 3ML LL SCALE MARK (SYRINGE) ×2 IMPLANT
SYR TB 1ML LUER SLIP (SYRINGE) ×2 IMPLANT
WATER STERILE IRR 250ML POUR (IV SOLUTION) ×2 IMPLANT
WATER STERILE IRR 500ML POUR (IV SOLUTION) IMPLANT
WICK EYE OCUCEL (MISCELLANEOUS) IMPLANT
WIPE NON LINTING 3.25X3.25 (MISCELLANEOUS) ×2 IMPLANT

## 2015-08-15 NOTE — Anesthesia Postprocedure Evaluation (Signed)
  Anesthesia Post-op Note  Patient: Kathleen Baker  Procedure(s) Performed: Procedure(s) with comments: CATARACT EXTRACTION PHACO AND INTRAOCULAR LENS PLACEMENT (IOC) (Left) - C-PAP  Anesthesia type:MAC  Patient location: PACU  Post pain: Pain level controlled  Post assessment: Post-op Vital signs reviewed, Patient's Cardiovascular Status Stable, Respiratory Function Stable, Patent Airway and No signs of Nausea or vomiting  Post vital signs: Reviewed and stable  Last Vitals:  Filed Vitals:   08/15/15 0815  BP: 121/87  Pulse:   Temp: 36.5 C  Resp:     Level of consciousness: awake, alert  and patient cooperative  Complications: No apparent anesthesia complications

## 2015-08-15 NOTE — Transfer of Care (Signed)
Immediate Anesthesia Transfer of Care Note  Patient: Kathleen Baker  Procedure(s) Performed: Procedure(s) with comments: CATARACT EXTRACTION PHACO AND INTRAOCULAR LENS PLACEMENT (IOC) (Left) - C-PAP  Patient Location: PACU  Anesthesia Type: MAC  Level of Consciousness: awake, alert  and patient cooperative  Airway and Oxygen Therapy: Patient Spontanous Breathing and Patient connected to supplemental oxygen  Post-op Assessment: Post-op Vital signs reviewed, Patient's Cardiovascular Status Stable, Respiratory Function Stable, Patent Airway and No signs of Nausea or vomiting  Post-op Vital Signs: Reviewed and stable  Complications: No apparent anesthesia complications

## 2015-08-15 NOTE — Op Note (Signed)
Date of Surgery: 08/15/2015  PREOPERATIVE DIAGNOSES: Visually significant nuclear sclerotic cataract, left eye.  POSTOPERATIVE DIAGNOSES: Same  PROCEDURES PERFORMED: Cataract extraction with intraocular lens implant, left eye.  SURGEON: Almon Hercules, M.D.  ANESTHESIA: MAC and topical  IMPLANTS: AcrySof IQ [id number] +24.0 D   Implant Name Type Inv. Item Serial No. Manufacturer Lot No. LRB No. Used  [id number] lens     [id number] ALCON   Left 1    COMPLICATIONS: None.  DESCRIPTION OF PROCEDURE: Therapeutic options were discussed with the patient preoperatively, including a discussion of risks and benefits of surgery. Informed consent was obtained. An IOL-Master and immersion biometry were used to take the lens measurements, and a dilated fundus exam was performed within 6 months of the surgical date.  The patient was premedicated and brought to the operating room and placed on the operating table in the supine position. After adequate anesthesia, the patient was prepped and draped in the usual sterile ophthalmic fashion. A wire lid speculum was inserted and the microscope was positioned. A Superblade was used to create a paracentesis site at the limbus and a small amount of dilute preservative free lidocaine was instilled into the anterior chamber, followed by dispersive viscoelastic. A clear corneal incision was created temporally using a 2.4 mm keratome blade. Capsulorrhexis was then performed. In situ phacoemulsification was performed.  Cortical material was removed with the irrigation-aspiration unit. Dispersive viscoelastic was instilled to open the capsular bag. A posterior chamber intraocular lens with the specifications above was inserted and positioned. Irrigation-aspiration was used to remove all viscoelastic. Cefuroxime 1cc was into the anterior chamber, and the corneal incision was checked and found to be water tight. The eyelid speculum was removed.  The operative eye was covered with  protective goggles after instilling 1 drop of timolol and brimonidine. The patient tolerated the procedure well. There were no complications.

## 2015-08-15 NOTE — Anesthesia Preprocedure Evaluation (Signed)
Anesthesia Evaluation  Patient identified by MRN, date of birth, ID band  Reviewed: Allergy & Precautions, H&P , NPO status , Patient's Chart, lab work & pertinent test results  History of Anesthesia Complications (+) PONV and history of anesthetic complications  Airway Mallampati: II  TM Distance: >3 FB Neck ROM: full    Dental no notable dental hx.    Pulmonary sleep apnea , former smoker,    Pulmonary exam normal       Cardiovascular hypertension, Rhythm:regular Rate:Normal     Neuro/Psych    GI/Hepatic   Endo/Other    Renal/GU      Musculoskeletal   Abdominal   Peds  Hematology   Anesthesia Other Findings   Reproductive/Obstetrics                             Anesthesia Physical Anesthesia Plan  ASA: II  Anesthesia Plan: MAC   Post-op Pain Management:    Induction:   Airway Management Planned:   Additional Equipment:   Intra-op Plan:   Post-operative Plan:   Informed Consent: I have reviewed the patients History and Physical, chart, labs and discussed the procedure including the risks, benefits and alternatives for the proposed anesthesia with the patient or authorized representative who has indicated his/her understanding and acceptance.     Plan Discussed with: CRNA  Anesthesia Plan Comments:         Anesthesia Quick Evaluation

## 2015-08-15 NOTE — Anesthesia Procedure Notes (Signed)
Procedure Name: MAC Performed by: Florena Kozma Pre-anesthesia Checklist: Patient identified, Emergency Drugs available, Suction available, Timeout performed and Patient being monitored Patient Re-evaluated:Patient Re-evaluated prior to inductionOxygen Delivery Method: Nasal cannula Placement Confirmation: positive ETCO2     

## 2015-08-16 ENCOUNTER — Encounter: Payer: Self-pay | Admitting: Ophthalmology

## 2015-09-12 ENCOUNTER — Other Ambulatory Visit: Payer: Self-pay | Admitting: Internal Medicine

## 2015-09-12 DIAGNOSIS — Z1231 Encounter for screening mammogram for malignant neoplasm of breast: Secondary | ICD-10-CM

## 2015-09-13 ENCOUNTER — Other Ambulatory Visit: Payer: Self-pay | Admitting: Internal Medicine

## 2015-09-13 DIAGNOSIS — Z853 Personal history of malignant neoplasm of breast: Secondary | ICD-10-CM

## 2015-10-21 ENCOUNTER — Other Ambulatory Visit: Payer: Self-pay

## 2015-10-21 DIAGNOSIS — D0511 Intraductal carcinoma in situ of right breast: Secondary | ICD-10-CM

## 2015-12-15 ENCOUNTER — Ambulatory Visit
Admission: RE | Admit: 2015-12-15 | Discharge: 2015-12-15 | Disposition: A | Discharge status: Home / Self Care | Payer: Medicare Other | Source: Ambulatory Visit | Attending: Internal Medicine | Admitting: Internal Medicine

## 2015-12-15 ENCOUNTER — Ambulatory Visit: Payer: PRIVATE HEALTH INSURANCE

## 2015-12-15 DIAGNOSIS — Z853 Personal history of malignant neoplasm of breast: Secondary | ICD-10-CM | POA: Insufficient documentation

## 2015-12-15 DIAGNOSIS — Z9012 Acquired absence of left breast and nipple: Secondary | ICD-10-CM | POA: Insufficient documentation

## 2015-12-15 HISTORY — DX: Malignant neoplasm of unspecified site of unspecified female breast: C50.919

## 2015-12-22 ENCOUNTER — Encounter: Payer: Self-pay | Admitting: General Surgery

## 2015-12-22 ENCOUNTER — Ambulatory Visit (INDEPENDENT_AMBULATORY_CARE_PROVIDER_SITE_OTHER): Payer: Medicare Other | Admitting: General Surgery

## 2015-12-22 VITALS — BP 142/84 | HR 70 | Resp 14 | Ht 59.0 in | Wt 153.0 lb

## 2015-12-22 DIAGNOSIS — D0511 Intraductal carcinoma in situ of right breast: Secondary | ICD-10-CM | POA: Diagnosis not present

## 2015-12-22 DIAGNOSIS — Z853 Personal history of malignant neoplasm of breast: Secondary | ICD-10-CM | POA: Diagnosis not present

## 2015-12-22 NOTE — Patient Instructions (Addendum)
The patient has been asked to return to the office in one year with a right diagnostic mammogram. Continue self breast exams. Call office for any new breast issues or concerns. Continue Tamoxifen

## 2015-12-22 NOTE — Progress Notes (Signed)
Patient ID: Kathleen Baker, female   DOB: 02-21-1925, 81 y.o.   MRN: [id number]  Chief Complaint  Patient presents with  . Follow-up    HPI Kathleen Baker is a 81 y.o. female.  who presents for follow up breast cancer and a breast evaluation. The most recent mammogram was done on 12-15-15.  Patient does perform regular self breast checks and gets regular mammograms done.   No new breast issues. I personally reviewed the patient's history. HPI  Past Medical History  Diagnosis Date  . Hypertension   . Hyperlipidemia   . Restless leg   . Dysrhythmia   . Cancer ([place])   . 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 Complication of anesthesia     N/V  . PONV (postoperative nausea and vomiting)   . Shortness of breath dyspnea     with exertion  . Wears dentures     full upper  . Motion sickness     [place]  . Sleep apnea     C-PAP  . Breast cancer ([place]) 1986 and 2014    right breast ca  . Breast cancer ([place]) 2000    left breast ca with mastectomy    Past Surgical History  Procedure Laterality Date  . Tonsillectomy    . Bunionectomy    . Cataract extraction w/phaco Left 08/15/2015    Procedure: CATARACT EXTRACTION PHACO AND INTRAOCULAR LENS PLACEMENT (IOC);  Surgeon: Ronnell Freshwater, MD;  Location: [facility];  Service: Ophthalmology;  Laterality: Left;  C-PAP  . Mastectomy Left 2000  . Breast surgery Left 2000    mastectomy, completed at [facility] in [place], [place].  . Breast surgery Right 1986    LUMPECTOMY with reexcision. Completed in [facility]  . Breast surgery Right 01/13/14    Right DCIS, no radiation therapy.  . Breast biopsy Right 11/2013    dcis    No family history on file.  Social History Social History  Substance Use Topics  . Smoking status: Former Smoker    Types: Cigarettes    Quit date: 12/31/1992  . Smokeless tobacco: Never  Used  . Alcohol Use: No    Allergies  Allergen Reactions  . Codeine Nausea And Vomiting  . Morphine And Related Nausea And Vomiting    Current Outpatient Prescriptions  Medication Sig Dispense Refill  . acetaminophen (TYLENOL) 325 MG tablet Take 325 mg by mouth every 6 (six) hours as needed.    Marland Kitchen aspirin 81 MG tablet Take 81 mg by mouth daily. AM    . atorvastatin (LIPITOR) 40 MG tablet Take 1 tablet by mouth daily at 6 (six) AM. BEDTIME    . benazepril (LOTENSIN) 10 MG tablet Take 1 tablet by mouth daily. AM    . clonazePAM (KLONOPIN) 1 MG tablet Take 1 tablet by mouth daily. BEDTIME    . tamoxifen (NOLVADEX) 20 MG tablet TAKE 1 TABLET (20 MG TOTAL) BY MOUTH DAILY. 30 tablet 12  . cephALEXin (KEFLEX) 500 MG capsule Take 500 mg by mouth 4 (four) times daily.     No current facility-administered medications for this visit.    Review of Systems Review of Systems  Constitutional: Negative.   Respiratory: Negative.   Cardiovascular: Negative.     Blood pressure 142/84, pulse 70, resp. rate 14, height 4\' 11"  (1.499 m), weight 153 lb (69.4 kg).  Physical Exam Physical Exam  Constitutional: She is oriented to person, place, and time. She appears well-developed and well-nourished.  HENT:  Mouth/Throat: Oropharynx is clear and moist.  Eyes: Conjunctivae are normal. No scleral icterus.  Neck: Neck supple.  Cardiovascular: Normal rate, regular rhythm and normal heart sounds.   Pulmonary/Chest: Effort normal and breath sounds normal. Right breast exhibits no inverted nipple, no mass, no nipple discharge, no skin change and no tenderness.    Right incision at 3 o'clock well healed with mild dimpling upper inner quadrant. Left mastectomy site well healed.   Lymphadenopathy:    She has no cervical adenopathy.    She has no axillary adenopathy.  Neurological: She is alert and oriented to person, place, and time.  Skin: Skin is warm and dry.  Psychiatric: Her behavior is normal.     Data Reviewed 12/15/2015 right breast diagnostic mammograms were reviewed independently. No interval change. BI-RADS-2.  Assessment    Doing well status post wide excision of the right breast. Good tolerance of tamoxifen.    Plan        The patient has been asked to return to the office in one year with a right diagnostic mammogram. Continue Tamoxifen.  PCP:  Clayborn Bigness  This information has been scribed by Karie Fetch [place].  Robert Bellow 12/23/2015, 11:13 AM

## 2015-12-23 ENCOUNTER — Encounter: Payer: Self-pay | Admitting: General Surgery

## 2015-12-23 DIAGNOSIS — Z853 Personal history of malignant neoplasm of breast: Secondary | ICD-10-CM | POA: Insufficient documentation

## 2016-04-09 ENCOUNTER — Other Ambulatory Visit: Payer: Self-pay | Admitting: General Surgery

## 2016-10-16 ENCOUNTER — Other Ambulatory Visit: Payer: Self-pay

## 2016-10-16 DIAGNOSIS — D0511 Intraductal carcinoma in situ of right breast: Secondary | ICD-10-CM

## 2016-12-18 ENCOUNTER — Ambulatory Visit
Admission: RE | Admit: 2016-12-18 | Discharge: 2016-12-18 | Disposition: A | Discharge status: Home / Self Care | Payer: Medicare Other | Source: Ambulatory Visit | Attending: General Surgery | Admitting: General Surgery

## 2016-12-18 ENCOUNTER — Other Ambulatory Visit: Payer: Self-pay | Admitting: General Surgery

## 2016-12-18 DIAGNOSIS — D0511 Intraductal carcinoma in situ of right breast: Secondary | ICD-10-CM

## 2016-12-18 DIAGNOSIS — Z9889 Other specified postprocedural states: Secondary | ICD-10-CM | POA: Insufficient documentation

## 2016-12-19 ENCOUNTER — Encounter: Payer: Self-pay | Admitting: *Deleted

## 2017-01-01 ENCOUNTER — Encounter: Payer: Self-pay | Admitting: General Surgery

## 2017-01-01 ENCOUNTER — Ambulatory Visit (INDEPENDENT_AMBULATORY_CARE_PROVIDER_SITE_OTHER): Payer: Medicare Other | Admitting: General Surgery

## 2017-01-01 VITALS — BP 132/74 | HR 66 | Resp 12 | Ht 59.0 in | Wt 163.0 lb

## 2017-01-01 DIAGNOSIS — Z853 Personal history of malignant neoplasm of breast: Secondary | ICD-10-CM

## 2017-01-01 MED ORDER — TAMOXIFEN CITRATE 20 MG PO TABS: 20.0000 mg | ORAL_TABLET | Freq: Every day | ORAL | 3 refills | Status: DC

## 2017-01-01 NOTE — Patient Instructions (Addendum)
The patient has been asked to return to the office in one year with a right diagnostic mammogram. 

## 2017-01-01 NOTE — Progress Notes (Signed)
Patient ID: Kathleen Baker, female   DOB: [birth year], 83 y.o.   MRN: [id number]  Chief Complaint  Patient presents with  . Follow-up    mammogram     HPI Kathleen Baker is a 83 y.o. female who presents for breast cancer and a breast evaluation. The most recent mammogram was done on 12/18/2016. No new breast issues.  Patient does perform regular self breast checks and gets regular mammograms done.   She is here with her niece, Elba Barman.  HPI  Past Medical History:  Diagnosis Date  . Breast cancer ([place]) 1986 and 2014   right breast ca  . Breast cancer ([place]) 2000   left breast ca with mastectomy  . Cancer ([place])   . Complication of anesthesia    N/V  . Dysrhythmia   . Hyperlipidemia   . Hypertension   . Malignant neoplasm of breast (female), unspecified site December 2014   DCIS, right breast. ER 90%, PR 90%  . Motion sickness    [place]  . PONV (postoperative nausea and vomiting)   . Restless leg   . Shortness of breath dyspnea    with exertion  . Sleep apnea    C-PAP  . Wears dentures    full upper    Past Surgical History:  Procedure Laterality Date  . BREAST BIOPSY Right 11/2013   dcis  . BREAST SURGERY Left 2000   mastectomy, completed at [facility] in [place], [place].  Marland Kitchen BREAST SURGERY Right 1986   LUMPECTOMY with reexcision. Completed in [facility]  . BREAST SURGERY Right 01/13/14   Right DCIS, no radiation therapy.  Lillard Anes    . CATARACT EXTRACTION W/PHACO Left 08/15/2015   Procedure: CATARACT EXTRACTION PHACO AND INTRAOCULAR LENS PLACEMENT (IOC);  Surgeon: Ronnell Freshwater, MD;  Location: [facility];  Service: Ophthalmology;  Laterality: Left;  C-PAP  . MASTECTOMY Left 2000  . TONSILLECTOMY      No family history on file.  Social History Social History  Substance Use Topics  . Smoking status: Former Smoker    Types: Cigarettes    Quit date: 12/31/1992  . Smokeless tobacco: Never Used   . Alcohol use No    Allergies  Allergen Reactions  . Codeine Nausea And Vomiting  . Morphine And Related Nausea And Vomiting    Current Outpatient Prescriptions  Medication Sig Dispense Refill  . acetaminophen (TYLENOL) 325 MG tablet Take 325 mg by mouth every 6 (six) hours as needed.    Marland Kitchen aspirin 81 MG tablet Take 81 mg by mouth as needed. AM    . benazepril (LOTENSIN) 10 MG tablet Take 1 tablet by mouth daily. AM    . clonazePAM (KLONOPIN) 1 MG tablet Take 1 tablet by mouth daily. BEDTIME    . tamoxifen (NOLVADEX) 20 MG tablet TAKE 1 TABLET (20 MG TOTAL) BY MOUTH DAILY. 30 tablet 10  . tamoxifen (NOLVADEX) 20 MG tablet Take 1 tablet (20 mg total) by mouth daily. 90 tablet 3   No current facility-administered medications for this visit.     Review of Systems Review of Systems  Constitutional: Negative.   Respiratory: Negative.   Cardiovascular: Negative.     Blood pressure 132/74, pulse 66, resp. rate 12, height 4\' 11"  (1.499 m), weight 163 lb (73.9 kg).  Physical Exam Physical Exam  Constitutional: She is oriented to person, place, and time. She appears well-developed and well-nourished.  HENT:  Mouth/Throat: Oropharynx is clear and moist.  Eyes: Conjunctivae are normal. No scleral icterus.  Neck: Neck supple.  Cardiovascular: Normal rate, regular rhythm and normal heart sounds.   Pulmonary/Chest: Effort normal and breath sounds normal. Right breast exhibits no inverted nipple, no mass, no nipple discharge, no skin change and no tenderness.    Left mastectomy site well healed.  Abdominal: Soft.  Lymphadenopathy:    She has no cervical adenopathy.    She has no axillary adenopathy.  Neurological: She is alert and oriented to person, place, and time.  Skin: Skin is warm and dry.  Psychiatric: Her behavior is normal.    Data Reviewed 12/18/2016 mammogram reviewed, benign. BI-RADS-2.  Assessment    Benign breast exam.  Good tolerance of tamoxifen.      Plan        The patient has been asked to return to the office in one year with a Uni right diagnostic mammogram.  This information has been scribed by Karie Fetch RN, BSN,BC.    Robert Bellow 01/02/2017, 2:15 PM

## 2017-07-25 ENCOUNTER — Ambulatory Visit: Payer: Medicare Other | Admitting: Podiatry

## 2018-03-20 ENCOUNTER — Ambulatory Visit (INDEPENDENT_AMBULATORY_CARE_PROVIDER_SITE_OTHER): Payer: Medicare Other | Admitting: Podiatry

## 2018-03-20 ENCOUNTER — Encounter: Payer: Self-pay | Admitting: Podiatry

## 2018-03-20 DIAGNOSIS — Q828 Other specified congenital malformations of skin: Secondary | ICD-10-CM

## 2018-03-20 DIAGNOSIS — B351 Tinea unguium: Secondary | ICD-10-CM | POA: Diagnosis not present

## 2018-03-20 DIAGNOSIS — M79674 Pain in right toe(s): Secondary | ICD-10-CM

## 2018-03-20 DIAGNOSIS — M79675 Pain in left toe(s): Secondary | ICD-10-CM

## 2018-03-20 NOTE — Progress Notes (Signed)
Complaint:  Visit Type: Patient returns to my office for  preventative foot care services. Complaint: patient has not been seen fr nails for over 1 year.  She presents with her  Gay Filler.   Patient states" my nails have grown long and thick and become painful to walk and wear shoes" Patient also has painful calluses on the bottom of her right foot  which hurt when she walks.  The patient presents for preventative foot care services. No changes to ROS  Podiatric Exam: Vascular: dorsalis pedis  are palpable bilateral. Posterior tibial pulse is not palpable due to ankle swelling.  CR  is immediate. Temperature gradient is WNL. Skin turgor WNL  Sensorium: Normal Semmes Weinstein monofilament test. Normal tactile sensation bilaterally. Nail Exam: Pt has thick disfigured discolored nails with subungual debris noted bilateral entire nail hallux through fifth toenails Ulcer Exam: There is no evidence of ulcer or pre-ulcerative changes or infection. Orthopedic Exam: Muscle tone and strength are WNL. No limitations in general ROM. No crepitus or effusions noted. Foot type and digits show no abnormalities. Bony prominences are unremarkable. Skin: No Porokeratosis. No infection or ulcers.  Callus sub 1,5  Right foot.  Diagnosis:  Onychomycosis, , Pain in right toe, pain in left toes Callus  Right foot.  Treatment & Plan Procedures and Treatment: Consent by patient was obtained for treatment procedures.   Debridement of mycotic and hypertrophic toenails, 1 through 5 bilateral and clearing of subungual debris. Debridement of callus right foot. No ulceration, no infection noted.  Return Visit-Office Procedure: Patient instructed to return to the office for a follow up visit 3 months for continued evaluation and treatment.    Gardiner Barefoot DPM

## 2018-06-26 ENCOUNTER — Encounter: Payer: Self-pay | Admitting: Podiatry

## 2018-06-26 ENCOUNTER — Ambulatory Visit (INDEPENDENT_AMBULATORY_CARE_PROVIDER_SITE_OTHER): Payer: Medicare Other | Admitting: Podiatry

## 2018-06-26 DIAGNOSIS — M79675 Pain in left toe(s): Secondary | ICD-10-CM

## 2018-06-26 DIAGNOSIS — Q828 Other specified congenital malformations of skin: Secondary | ICD-10-CM

## 2018-06-26 DIAGNOSIS — M79674 Pain in right toe(s): Secondary | ICD-10-CM | POA: Diagnosis not present

## 2018-06-26 DIAGNOSIS — B351 Tinea unguium: Secondary | ICD-10-CM

## 2018-06-26 NOTE — Progress Notes (Signed)
Complaint:  Visit Type: Patient returns to my office for  preventative foot care services. Complaint: patient has not been seen fr nails for over 1 year.  She presents with her  Gay Filler.   Patient states" my nails have grown long and thick and become painful to walk and wear shoes" Patient also has painful calluses on the bottom of her right foot  which hurt when she walks.  The patient presents for preventative foot care services. No changes to ROS  Podiatric Exam: Vascular: dorsalis pedis  are palpable bilateral. Posterior tibial pulse is not palpable due to ankle swelling.  CR  is immediate. Temperature gradient is WNL. Skin turgor WNL  Sensorium: Normal Semmes Weinstein monofilament test. Normal tactile sensation bilaterally. Nail Exam: Pt has thick disfigured discolored nails with subungual debris noted bilateral entire nail hallux through fifth toenails Ulcer Exam: There is no evidence of ulcer or pre-ulcerative changes or infection. Orthopedic Exam: Muscle tone and strength are WNL. No limitations in general ROM. No crepitus or effusions noted. Foot type and digits show no abnormalities. Bony prominences are unremarkable. Skin: No Porokeratosis. No infection or ulcers.  Callus sub 1,5  Right foot.  Diagnosis:  Onychomycosis, , Pain in right toe, pain in left toes Callus  Right foot.  Treatment & Plan Procedures and Treatment: Consent by patient was obtained for treatment procedures.   Debridement of mycotic and hypertrophic toenails, 1 through 5 bilateral and clearing of subungual debris. Debridement of callus right foot. No ulceration, no infection noted.  Return Visit-Office Procedure: Patient instructed to return to the office for a follow up visit 10 weeks  for continued evaluation and treatment.    Gardiner Barefoot DPM

## 2018-08-28 ENCOUNTER — Ambulatory Visit: Payer: Medicare Other | Admitting: Podiatry

## 2018-09-04 ENCOUNTER — Ambulatory Visit: Payer: Medicare Other | Admitting: Podiatry

## 2019-01-08 ENCOUNTER — Encounter: Payer: Self-pay | Admitting: Podiatry

## 2019-01-08 ENCOUNTER — Ambulatory Visit (INDEPENDENT_AMBULATORY_CARE_PROVIDER_SITE_OTHER): Payer: Medicare Other | Admitting: Podiatry

## 2019-01-08 DIAGNOSIS — M79675 Pain in left toe(s): Secondary | ICD-10-CM

## 2019-01-08 DIAGNOSIS — M79674 Pain in right toe(s): Secondary | ICD-10-CM | POA: Diagnosis not present

## 2019-01-08 DIAGNOSIS — Q828 Other specified congenital malformations of skin: Secondary | ICD-10-CM | POA: Diagnosis not present

## 2019-01-08 DIAGNOSIS — B351 Tinea unguium: Secondary | ICD-10-CM

## 2019-01-08 NOTE — Progress Notes (Signed)
Complaint:  Visit Type: Patient returns to my office for  preventative foot care services. Complaint: patient has not been seen fr nails for over 1 year.  She presents with her  Gay Filler.   Patient states" my nails have grown long and thick and become painful to walk and wear shoes" Patient also has painful calluses on the bottom of her right foot  which hurt when she walks.  The patient presents for preventative foot care services. No changes to ROS  Podiatric Exam: Vascular: dorsalis pedis  are palpable bilateral. Posterior tibial pulse is not palpable due to ankle swelling.  CR  is immediate. Temperature gradient is WNL. Skin turgor WNL  Sensorium: Normal Semmes Weinstein monofilament test. Normal tactile sensation bilaterally. Nail Exam: Pt has thick disfigured discolored nails with subungual debris noted bilateral entire nail hallux through fifth toenails Ulcer Exam: There is no evidence of ulcer or pre-ulcerative changes or infection. Orthopedic Exam: Muscle tone and strength are WNL. No limitations in general ROM. No crepitus or effusions noted. Foot type and digits show no abnormalities. Bony prominences are unremarkable. Skin: No Porokeratosis. No infection or ulcers.  Callus sub,5  Right foot.  Diagnosis:  Onychomycosis, , Pain in right toe, pain in left toes Callus  Right foot.  Treatment & Plan Procedures and Treatment: Consent by patient was obtained for treatment procedures.   Debridement of mycotic and hypertrophic toenails, 1 through 5 bilateral and clearing of subungual debris. Debridement of callus right foot. No ulceration, no infection noted.  Return Visit-Office Procedure: Patient instructed to return to the office for a follow up visit 12 weeks  for continued evaluation and treatment.    Gardiner Barefoot DPM

## 2019-04-09 ENCOUNTER — Ambulatory Visit: Payer: Medicare Other | Admitting: Podiatry

## 2019-10-19 ENCOUNTER — Other Ambulatory Visit: Payer: Self-pay

## 2019-10-19 ENCOUNTER — Encounter: Payer: Self-pay | Admitting: Podiatry

## 2019-10-19 ENCOUNTER — Ambulatory Visit (INDEPENDENT_AMBULATORY_CARE_PROVIDER_SITE_OTHER): Payer: Medicare Other | Admitting: Podiatry

## 2019-10-19 DIAGNOSIS — B351 Tinea unguium: Secondary | ICD-10-CM

## 2019-10-19 DIAGNOSIS — M79675 Pain in left toe(s): Secondary | ICD-10-CM | POA: Diagnosis not present

## 2019-10-19 DIAGNOSIS — Q828 Other specified congenital malformations of skin: Secondary | ICD-10-CM

## 2019-10-19 DIAGNOSIS — M79674 Pain in right toe(s): Secondary | ICD-10-CM

## 2019-10-19 NOTE — Progress Notes (Signed)
Complaint:  Visit Type: Patient returns to my office for  preventative foot care services. Complaint: patient has not been seen fr nails for over 1 year.  She presents with her  Gay Filler.   Patient states" my nails have grown long and thick and become painful to walk and wear shoes" Patient also has painful calluses on the bottom of her right foot  which hurt when she walks.  The patient presents for preventative foot care services. No changes to ROS  Podiatric Exam: Vascular: dorsalis pedis  are palpable bilateral. Posterior tibial pulse is not palpable due to ankle swelling.  CR  is immediate. Temperature gradient is WNL. Skin turgor WNL  Sensorium: Normal Semmes Weinstein monofilament test. Normal tactile sensation bilaterally. Nail Exam: Pt has thick disfigured discolored nails with subungual debris noted bilateral entire nail hallux through fifth toenails Ulcer Exam: There is no evidence of ulcer or pre-ulcerative changes or infection. Orthopedic Exam: Muscle tone and strength are WNL. No limitations in general ROM. No crepitus or effusions noted. Foot type and digits show no abnormalities. Bony prominences are unremarkable. Skin: No Porokeratosis. No infection or ulcers.  Callus sub,5  Right foot.  Diagnosis:  Onychomycosis, , Pain in right toe, pain in left toes Callus  Right foot.  Treatment & Plan Procedures and Treatment: Consent by patient was obtained for treatment procedures.   Debridement of mycotic and hypertrophic toenails, 1 through 5 bilateral and clearing of subungual debris. Debridement of callus right foot. No ulceration, no infection noted.   Debridement of callus right foot. Return Visit-Office Procedure: Patient instructed to return to the office for a follow up visit 12 weeks  for continued evaluation and treatment.    Gardiner Barefoot DPM

## 2020-01-18 ENCOUNTER — Ambulatory Visit: Payer: Medicare Other | Admitting: Podiatry

## 2021-03-23 ENCOUNTER — Ambulatory Visit (INDEPENDENT_AMBULATORY_CARE_PROVIDER_SITE_OTHER): Payer: Medicare Other | Admitting: Podiatry

## 2021-03-23 ENCOUNTER — Other Ambulatory Visit: Payer: Self-pay

## 2021-03-23 ENCOUNTER — Encounter: Payer: Self-pay | Admitting: Podiatry

## 2021-03-23 DIAGNOSIS — M79675 Pain in left toe(s): Secondary | ICD-10-CM | POA: Diagnosis not present

## 2021-03-23 DIAGNOSIS — B351 Tinea unguium: Secondary | ICD-10-CM

## 2021-03-23 DIAGNOSIS — M79674 Pain in right toe(s): Secondary | ICD-10-CM | POA: Diagnosis not present

## 2021-03-23 NOTE — Progress Notes (Signed)
This patient returns to my office for at risk foot care.  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 This patient has not been seen in over 1 and 1/2 years.This patient is unable to cut nails herself since the patient cannot reach her nails.These nails are painful walking and wearing shoes.  This patient presents for at risk foot care today.She presents to the office with female caregiver.  Vascular  Dorsalis pedis and posterior tibial  pulses are weakly  palpable  bilaterally.  Capillary return is within normal limits  bilaterally. Cold feet  B/L.  Bilaterally. Absent digital hair  B/L.  Neurologic  Senn-Weinstein monofilament wire test within normal limits  bilaterally. Muscle power within normal limits bilaterally.  Nails Thick disfigured discolored nails with subungual debris  from hallux to fifth toes bilaterally. No evidence of bacterial infection or drainage bilaterally.  Orthopedic  No limitations of motion  feet .  No crepitus or effusions noted.  No bony pathology or digital deformities noted.  Skin  normotropic skin with no porokeratosis noted bilaterally.  No signs of infections or ulcers noted.     Onychomycosis  Pain in right toes  Pain in left toes  Consent was obtained for treatment procedures.   Mechanical debridement of nails 1-5  bilaterally performed with a nail nipper.  Filed with dremel without incident.    Return office visit   3 months                  Told patient to return for periodic foot care and evaluation due to potential at risk complications.   Gardiner Barefoot DPM

## 2021-06-01 ENCOUNTER — Ambulatory Visit (INDEPENDENT_AMBULATORY_CARE_PROVIDER_SITE_OTHER): Payer: Medicare Other | Admitting: Podiatry

## 2021-06-01 ENCOUNTER — Encounter: Payer: Self-pay | Admitting: Podiatry

## 2021-06-01 ENCOUNTER — Other Ambulatory Visit: Payer: Self-pay

## 2021-06-01 DIAGNOSIS — M79675 Pain in left toe(s): Secondary | ICD-10-CM

## 2021-06-01 DIAGNOSIS — B351 Tinea unguium: Secondary | ICD-10-CM

## 2021-06-01 DIAGNOSIS — M79674 Pain in right toe(s): Secondary | ICD-10-CM

## 2021-06-01 NOTE — Progress Notes (Signed)
This patient returns to my office for at risk foot care.  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 .This patient is unable to cut nails herself since the patient cannot reach her nails.These nails are painful walking and wearing shoes.  This patient presents for at risk foot care today.She presents to the office with female caregiver.  Vascular  Dorsalis pedis and posterior tibial  pulses are weakly  palpable  bilaterally.  Capillary return is within normal limits  bilaterally. Cold feet  B/L.  Bilaterally. Absent digital hair  B/L.  Neurologic  Senn-Weinstein monofilament wire test within normal limits  bilaterally. Muscle power within normal limits bilaterally.  Nails Thick disfigured discolored nails with subungual debris  from hallux to fifth toes bilaterally. No evidence of bacterial infection or drainage bilaterally.  Orthopedic  No limitations of motion  feet .  No crepitus or effusions noted.  No bony pathology or digital deformities noted.  Skin  normotropic skin with no porokeratosis noted bilaterally.  No signs of infections or ulcers noted.     Onychomycosis  Pain in right toes  Pain in left toes  Consent was obtained for treatment procedures.   Mechanical debridement of nails 1-5  bilaterally performed with a nail nipper.  Filed with dremel without incident.    Return office visit   9 weeks                  Told patient to return for periodic foot care and evaluation due to potential at risk complications.   Gardiner Barefoot DPM

## 2021-08-03 ENCOUNTER — Other Ambulatory Visit: Payer: Self-pay

## 2021-08-03 ENCOUNTER — Encounter: Payer: Self-pay | Admitting: Podiatry

## 2021-08-03 ENCOUNTER — Ambulatory Visit (INDEPENDENT_AMBULATORY_CARE_PROVIDER_SITE_OTHER): Payer: Medicare Other | Admitting: Podiatry

## 2021-08-03 DIAGNOSIS — M79674 Pain in right toe(s): Secondary | ICD-10-CM

## 2021-08-03 DIAGNOSIS — B351 Tinea unguium: Secondary | ICD-10-CM

## 2021-08-03 DIAGNOSIS — M79675 Pain in left toe(s): Secondary | ICD-10-CM | POA: Diagnosis not present

## 2021-08-03 DIAGNOSIS — Q828 Other specified congenital malformations of skin: Secondary | ICD-10-CM | POA: Diagnosis not present

## 2021-08-03 NOTE — Progress Notes (Signed)
This patient returns to my office for at risk foot care.  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 .This patient is unable to cut nails herself since the patient cannot reach her nails.These nails are painful walking and wearing shoes.  This patient presents for at risk foot care today.She presents to the office with caregiver., Miss Dixon  (sp)  Vascular  Dorsalis pedis and posterior tibial  pulses are weakly  palpable  bilaterally.  Capillary return is within normal limits  bilaterally. Cold feet  B/L.  Bilaterally. Absent digital hair  B/L.  Neurologic  Senn-Weinstein monofilament wire test within normal limits  bilaterally. Muscle power within normal limits bilaterally.  Nails Thick disfigured discolored nails with subungual debris  from hallux to fifth toes bilaterally. No evidence of bacterial infection or drainage bilaterally.  Orthopedic  No limitations of motion  feet .  No crepitus or effusions noted.  No bony pathology or digital deformities noted.  Skin  normotropic skin with no porokeratosis noted bilaterally.  No signs of infections or ulcers noted.     Onychomycosis  Pain in right toes  Pain in left toes  Consent was obtained for treatment procedures.   Mechanical debridement of nails 1-5  bilaterally performed with a nail nipper.  Filed with dremel without incident.    Return office visit   9 weeks                  Told patient to return for periodic foot care and evaluation due to potential at risk complications.   Gardiner Barefoot DPM

## 2021-09-21 ENCOUNTER — Emergency Department: Payer: Medicare Other

## 2021-09-21 ENCOUNTER — Inpatient Hospital Stay
Admission: EM | Admit: 2021-09-21 | Discharge: 2021-09-23 | DRG: 071 | Disposition: A | Discharge status: Home Health | Payer: Medicare Other | Attending: Internal Medicine | Admitting: Internal Medicine

## 2021-09-21 ENCOUNTER — Other Ambulatory Visit: Payer: Self-pay

## 2021-09-21 DIAGNOSIS — M79604 Pain in right leg: Secondary | ICD-10-CM

## 2021-09-21 DIAGNOSIS — J189 Pneumonia, unspecified organism: Secondary | ICD-10-CM

## 2021-09-21 DIAGNOSIS — D044 Carcinoma in situ of skin of scalp and neck: Secondary | ICD-10-CM

## 2021-09-21 DIAGNOSIS — Z6832 Body mass index (BMI) 32.0-32.9, adult: Secondary | ICD-10-CM

## 2021-09-21 DIAGNOSIS — Z853 Personal history of malignant neoplasm of breast: Secondary | ICD-10-CM

## 2021-09-21 DIAGNOSIS — R5381 Other malaise: Secondary | ICD-10-CM | POA: Diagnosis not present

## 2021-09-21 DIAGNOSIS — R4182 Altered mental status, unspecified: Secondary | ICD-10-CM | POA: Diagnosis not present

## 2021-09-21 DIAGNOSIS — M25551 Pain in right hip: Secondary | ICD-10-CM | POA: Diagnosis present

## 2021-09-21 DIAGNOSIS — Z20822 Contact with and (suspected) exposure to covid-19: Secondary | ICD-10-CM | POA: Diagnosis present

## 2021-09-21 DIAGNOSIS — Z79899 Other long term (current) drug therapy: Secondary | ICD-10-CM

## 2021-09-21 DIAGNOSIS — E538 Deficiency of other specified B group vitamins: Secondary | ICD-10-CM | POA: Diagnosis present

## 2021-09-21 DIAGNOSIS — R262 Difficulty in walking, not elsewhere classified: Secondary | ICD-10-CM | POA: Diagnosis present

## 2021-09-21 DIAGNOSIS — G9341 Metabolic encephalopathy: Principal | ICD-10-CM | POA: Diagnosis present

## 2021-09-21 DIAGNOSIS — E785 Hyperlipidemia, unspecified: Secondary | ICD-10-CM | POA: Diagnosis present

## 2021-09-21 DIAGNOSIS — Z86007 Personal history of in-situ neoplasm of skin: Secondary | ICD-10-CM

## 2021-09-21 DIAGNOSIS — M25561 Pain in right knee: Secondary | ICD-10-CM | POA: Diagnosis present

## 2021-09-21 DIAGNOSIS — Z9181 History of falling: Secondary | ICD-10-CM

## 2021-09-21 DIAGNOSIS — J40 Bronchitis, not specified as acute or chronic: Secondary | ICD-10-CM | POA: Diagnosis present

## 2021-09-21 DIAGNOSIS — Z87891 Personal history of nicotine dependence: Secondary | ICD-10-CM

## 2021-09-21 DIAGNOSIS — Z885 Allergy status to narcotic agent status: Secondary | ICD-10-CM

## 2021-09-21 DIAGNOSIS — I10 Essential (primary) hypertension: Secondary | ICD-10-CM | POA: Diagnosis present

## 2021-09-21 DIAGNOSIS — R404 Transient alteration of awareness: Secondary | ICD-10-CM | POA: Diagnosis not present

## 2021-09-21 DIAGNOSIS — R627 Adult failure to thrive: Secondary | ICD-10-CM | POA: Diagnosis not present

## 2021-09-21 DIAGNOSIS — R54 Age-related physical debility: Secondary | ICD-10-CM | POA: Diagnosis present

## 2021-09-21 DIAGNOSIS — R64 Cachexia: Secondary | ICD-10-CM | POA: Diagnosis present

## 2021-09-21 DIAGNOSIS — Z9010 Acquired absence of unspecified breast and nipple: Secondary | ICD-10-CM

## 2021-09-21 LAB — CBC
HCT: 42.9 % (ref 36.0–46.0)
Hemoglobin: 14.1 g/dL (ref 12.0–15.0)
MCH: 32.7 pg (ref 26.0–34.0)
MCHC: 32.9 g/dL (ref 30.0–36.0)
MCV: 99.5 fL (ref 80.0–100.0)
Platelets: 100 10*3/uL — ABNORMAL LOW (ref 150–400)
RBC: 4.31 MIL/uL (ref 3.87–5.11)
RDW: 14.3 % (ref 11.5–15.5)
WBC: 9.1 10*3/uL (ref 4.0–10.5)
nRBC: 0 % (ref 0.0–0.2)

## 2021-09-21 LAB — COMPREHENSIVE METABOLIC PANEL
ALT: 13 U/L (ref 0–44)
AST: 19 U/L (ref 15–41)
Albumin: 3.1 g/dL — ABNORMAL LOW (ref 3.5–5.0)
Alkaline Phosphatase: 53 U/L (ref 38–126)
Anion gap: 9 (ref 5–15)
BUN: 20 mg/dL (ref 8–23)
CO2: 24 mmol/L (ref 22–32)
Calcium: 8.6 mg/dL — ABNORMAL LOW (ref 8.9–10.3)
Chloride: 108 mmol/L (ref 98–111)
Creatinine, Ser: 1.25 mg/dL — ABNORMAL HIGH (ref 0.44–1.00)
GFR, Estimated: 40 mL/min — ABNORMAL LOW (ref >60)
Glucose, Bld: 120 mg/dL — ABNORMAL HIGH (ref 70–99)
Potassium: 3.8 mmol/L (ref 3.5–5.1)
Sodium: 141 mmol/L (ref 135–145)
Total Bilirubin: 1.8 mg/dL — ABNORMAL HIGH (ref 0.3–1.2)
Total Protein: 6.8 g/dL (ref 6.5–8.1)

## 2021-09-21 LAB — RESP PANEL BY RT-PCR (FLU A&B, COVID) ARPGX2
Influenza A by PCR: NEGATIVE
Influenza B by PCR: NEGATIVE
SARS Coronavirus 2 by RT PCR: NEGATIVE

## 2021-09-21 LAB — PROCALCITONIN: Procalcitonin: 0.6 ng/mL

## 2021-09-21 LAB — TROPONIN I (HIGH SENSITIVITY): Troponin I (High Sensitivity): 14 ng/L (ref <18)

## 2021-09-21 LAB — TSH: TSH: 2.204 u[IU]/mL (ref 0.350–4.500)

## 2021-09-21 MED ORDER — ONDANSETRON HCL 4 MG PO TABS: 4.0000 mg | ORAL_TABLET | Freq: Four times a day (QID) | ORAL | Status: DC | PRN

## 2021-09-21 MED ORDER — ONDANSETRON HCL 4 MG/2ML IJ SOLN
4.0000 mg | Freq: Four times a day (QID) | INTRAMUSCULAR | Status: DC | PRN
Start: 2021-09-21 — End: 2021-09-23

## 2021-09-21 MED ORDER — ACETAMINOPHEN 650 MG RE SUPP: 650.0000 mg | Freq: Four times a day (QID) | RECTAL | Status: DC | PRN

## 2021-09-21 MED ORDER — CEFTRIAXONE SODIUM 1 G IJ SOLR
1.0000 g | INTRAMUSCULAR | Status: DC
Start: 2021-09-21 — End: 2021-09-23
  Administered 2021-09-21 – 2021-09-22 (×2): 1 g via INTRAVENOUS
  Filled 2021-09-21 (×3): qty 10

## 2021-09-21 MED ORDER — LIDOCAINE 5 % EX PTCH
2.0000 | MEDICATED_PATCH | CUTANEOUS | Status: DC
  Administered 2021-09-22: 21:00:00 2 via TRANSDERMAL
  Filled 2021-09-21 (×2): qty 2

## 2021-09-21 MED ORDER — HALOPERIDOL LACTATE 5 MG/ML IJ SOLN
2.0000 mg | Freq: Once | INTRAMUSCULAR | Status: AC
  Administered 2021-09-21: 2 mg via INTRAVENOUS
  Filled 2021-09-21: qty 1

## 2021-09-21 MED ORDER — ACETAMINOPHEN 325 MG PO TABS: 650.0000 mg | ORAL_TABLET | Freq: Four times a day (QID) | ORAL | Status: DC | PRN

## 2021-09-21 MED ORDER — ENOXAPARIN SODIUM 40 MG/0.4ML IJ SOSY
40.0000 mg | PREFILLED_SYRINGE | INTRAMUSCULAR | Status: DC
  Administered 2021-09-21: 40 mg via SUBCUTANEOUS
  Filled 2021-09-21: qty 0.4

## 2021-09-21 MED ORDER — LORAZEPAM 2 MG/ML IJ SOLN: 1.0000 mg | Freq: Once | INTRAMUSCULAR | Status: AC

## 2021-09-21 MED ORDER — HALOPERIDOL LACTATE 5 MG/ML IJ SOLN: 1.0000 mg | Freq: Four times a day (QID) | INTRAMUSCULAR | Status: DC | PRN

## 2021-09-21 MED ORDER — HYDRALAZINE HCL 20 MG/ML IJ SOLN: 5.0000 mg | Freq: Three times a day (TID) | INTRAMUSCULAR | Status: DC | PRN

## 2021-09-21 MED ORDER — AZITHROMYCIN 500 MG IV SOLR
500.0000 mg | INTRAVENOUS | Status: DC
  Administered 2021-09-21 – 2021-09-22 (×2): 500 mg via INTRAVENOUS
  Filled 2021-09-21 (×3): qty 500

## 2021-09-21 MED ORDER — HALOPERIDOL LACTATE 5 MG/ML IJ SOLN
1.0000 mg | Freq: Three times a day (TID) | INTRAMUSCULAR | Status: DC | PRN
Start: 2021-09-21 — End: 2021-09-23

## 2021-09-21 MED ORDER — KETOROLAC TROMETHAMINE 15 MG/ML IJ SOLN
7.5000 mg | Freq: Four times a day (QID) | INTRAMUSCULAR | Status: AC | PRN
  Filled 2021-09-21: qty 1

## 2021-09-21 MED ORDER — LORAZEPAM 2 MG/ML IJ SOLN
INTRAMUSCULAR | Status: AC
  Administered 2021-09-21: 1 mg via INTRAVENOUS
  Filled 2021-09-21: qty 1

## 2021-09-21 NOTE — ED Notes (Signed)
Patient transported to CT 

## 2021-09-21 NOTE — ED Provider Notes (Signed)
[facility] Emergency Department Provider Note    Event Date/Time   First MD Initiated Contact with Patient 09/21/21 1400     (approximate)  I have reviewed the triage vital signs and the nursing notes.   HISTORY  Chief Complaint Altered Mental Status  Level V Caveat:  AMS  HPI Kathleen Baker is a 87 y.o. female below listed past medical history presents to the ER for evaluation of right leg knee and hip pain occurred after mechanical fall up to 3 days ago and has been having increasing confusion and agitation at home since then.  Brought in by EMS.  Niece states she is no longer able to get her around at home as she is not walking.  No reported fevers no nausea or vomiting but states has had poor p.o. intake.  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 Past Surgical History:  Procedure Laterality Date   BREAST BIOPSY Right 11/2013   dcis   BREAST SURGERY Left 2000   mastectomy, completed at [facility] in [place], [place].   BREAST SURGERY Right 1986   LUMPECTOMY with reexcision. Completed in [place] Right 01/13/14   Right DCIS, no radiation therapy.   BUNIONECTOMY     CATARACT EXTRACTION W/PHACO Left 08/15/2015   Procedure: CATARACT EXTRACTION PHACO AND INTRAOCULAR LENS PLACEMENT (IOC);  Surgeon: Ronnell Freshwater, MD;  Location: [facility];   Service: Ophthalmology;  Laterality: Left;  C-PAP   MASTECTOMY Left 2000   TONSILLECTOMY     Patient Active Problem List   Diagnosis Date Noted   History of breast cancer 12/23/2015   Breast cancer ([place]) 01/21/2014   DCIS (ductal carcinoma in situ) of breast 01/01/2014      Prior to Admission medications   Not on File    Allergies Codeine and Morphine and related    Social History Social History   Tobacco Use   Smoking status: Former    Types: Cigarettes    Quit date: 12/31/1992    Years since quitting: 28.7   Smokeless tobacco: Never  Substance Use Topics   Alcohol use: No   Drug use: No    Review of Systems Patient denies headaches, rhinorrhea, blurry vision, numbness, shortness of breath, chest pain, edema, cough, abdominal pain, nausea, vomiting, diarrhea, dysuria, fevers, rashes or hallucinations unless otherwise stated above in HPI. ____________________________________________   PHYSICAL EXAM:  VITAL SIGNS: Vitals:   09/21/21 1202  BP: (!) 169/109  Pulse: 99  Resp: 18  Temp: 98.9 F (37.2 C)  SpO2: 97%    Constitutional: Alert, disoriented x 3 Eyes: Conjunctivae are normal.  Head: Atraumatic. Nose: No congestion/rhinnorhea. Mouth/Throat: Mucous membranes are moist.   Neck: No stridor.  Painless ROM.  Cardiovascular: Normal rate, regular rhythm. Grossly normal heart sounds.  Good peripheral circulation. Respiratory: Normal respiratory effort.  No retractions. Lungs CTAB. Gastrointestinal: Soft and nontender. No distention. No abdominal bruits. No CVA tenderness. Genitourinary:  Musculoskeletal: No lower extremity tenderness nor edema.  No joint effusions. Neurologic:  Normal speech and language. No gross focal neurologic deficits are appreciated. No facial droop Skin:  Skin is warm, dry and intact. No rash noted. Psychiatric: agitated and uncooperative with staff  ____________________________________________   LABS (all labs ordered are  listed, but only abnormal results are displayed)  Results for orders placed or performed during the hospital encounter of 09/21/21 (from the past 24 hour(s))  Comprehensive metabolic panel     Status: Abnormal   Collection Time: 09/21/21 12:06 PM  Result Value Ref Range   Sodium 141 135 - 145 mmol/L   Potassium 3.8 3.5 - 5.1 mmol/L   Chloride 108 98 - 111 mmol/L   CO2 24 22 - 32 mmol/L   Glucose, Bld 120 (H) 70 - 99 mg/dL   BUN 20 8 - 23 mg/dL   Creatinine, Ser 1.25 (H) 0.44 - 1.00 mg/dL   Calcium 8.6 (L) 8.9 - 10.3 mg/dL   Total Protein 6.8 6.5 - 8.1 g/dL   Albumin 3.1 (L) 3.5 - 5.0 g/dL   AST 19 15 - 41 U/L   ALT 13 0 - 44 U/L   Alkaline Phosphatase 53 38 - 126 U/L   Total Bilirubin 1.8 (H) 0.3 - 1.2 mg/dL   GFR, Estimated 40 (L) >60 mL/min   Anion gap 9 5 - 15  CBC     Status: Abnormal   Collection Time: 09/21/21 12:06 PM  Result Value Ref Range   WBC 9.1 4.0 - 10.5 K/uL   RBC 4.31 3.87 - 5.11 MIL/uL   Hemoglobin 14.1 12.0 - 15.0 g/dL   HCT 42.9 36.0 - 46.0 %   MCV 99.5 80.0 - 100.0 fL   MCH 32.7 26.0 - 34.0 pg   MCHC 32.9 30.0 - 36.0 g/dL   RDW 14.3 11.5 - 15.5 %   Platelets 100 (L) 150 - 400 K/uL   nRBC 0.0 0.0 - 0.2 %   ____________________________________________  EKG My review and personal interpretation at Time: 12:00   Indication: ams  Rate: 95  Rhythm: sinus Axis: normal Other: normal intervals, nonspecific st and t wave abn ____________________________________________  RADIOLOGY  I personally reviewed all radiographic images ordered to evaluate for the above acute complaints and reviewed radiology reports and findings.  These findings were personally discussed with the patient.  Please see medical record for radiology report.  ____________________________________________   PROCEDURES  Procedure(s) performed:  Procedures    Critical Care performed: no ____________________________________________   INITIAL IMPRESSION / ASSESSMENT AND PLAN / ED  COURSE  Pertinent labs & imaging results that were available during my care of the patient were reviewed by me and considered in my medical decision making (see chart for details).   DDX: Dehydration, sepsis, pna, uti, hypoglycemia, cva, drug effect, withdrawal, encephalitis   CHONDRA BOYDE is a 87 y.o. who presents to the ED with confusion symptoms as described above.  Patient uncooperative with exam is complaining of right hip and right knee pain.  X-rays ordered for that.  CT imaging without evidence of SDH or IPH.  She is not febrile Sepsis no white count.  Given IV fluids.  Has reportedly had poor p.o. intake will add on urinalysis.  Patient will be signed out to oncoming physician pending follow-up labs and imaging. anticipate admission.     The patient was evaluated in Emergency Department today for the symptoms described in the history of present illness. He/she was evaluated in the context of the global COVID-19 pandemic, which necessitated consideration that the patient might be at risk for infection with the SARS-CoV-2 virus that causes COVID-19. Institutional protocols and algorithms that pertain to the evaluation of patients at risk for COVID-19 are in a state of rapid change based on information released by regulatory bodies including the CDC and federal and state organizations. These policies and algorithms were followed during the patient's care in the ED.  As part of my medical decision making, I reviewed the following data within the [facility] notes reviewed and incorporated, Labs reviewed, notes from prior ED visits and [place] Controlled Substance Database   ____________________________________________   FINAL CLINICAL IMPRESSION(S) / ED DIAGNOSES  Final diagnoses:  Altered mental status, unspecified altered mental status type  Acute pain of right knee      NEW MEDICATIONS STARTED DURING THIS VISIT:  New Prescriptions   No medications on  file     Note:  This document was prepared using Dragon voice recognition software and may include unintentional dictation errors.    Merlyn Lot, MD 09/21/21 [phone]

## 2021-09-21 NOTE — H&P (Addendum)
History and Physical   Kathleen Baker CNO:[id number] DOB: 09-15-1925 DOA: 09/21/2021  PCP: Perrin Maltese, MD  Outpatient Specialists: Dr. Tyson Alias, dermatology Patient coming from: home   I have personally briefly reviewed patient's old medical records in [place].  Chief Concern: Confusion  HPI: Kathleen Baker is a 87 y.o. 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 prescribed medications, currently taking 5-fluorouracil injections at dermatology clinic, presents to the emergency department from home for chief concerns of confusion.  At bedside patient is awake.  She opens her mouth when I asked her to.  She mumbles her name.  She was not able to tell me her age.  She mumbles when she sees her great grand nephew.  Her great grand nephew was at bedside and states that about 4 days ago patient fell.  And for the last 4 days that she has been unable to walk and has been in bed.  She also endorses right leg pain at that time.  And over the course of the last 4 days patient has been increasingly confused including not able to say her niece's name.  And she has been mumbling.  Great grand nephew denies any fever, vomiting, diarrhea.  Patient did not endorse any chest pain or shortness of breath to the family.  Social history: Patient is currently living with her niece and great grand nephew.  ROS: Unable to complete due to patient confused and baseline dementia  ED Course: Discussed with emergency medicine provider, patient requiring hospitalization for chief concerns of altered mental status.  Vitals in the emergency department was remarkable for temperature 98.9, respiration rate of 18, heart rate of 99, initial blood pressure 169/109, SPO2 of 97% on room air.  Labs in the emergency department showed serum sodium 141, potassium 3.8, chloride 108, bicarb 24, BUN of 20,  serum creatinine 1.25, nonfasting blood glucose 120, EGFR 40.  WBC 9.1, hemoglobin 14.1, platelets 100.  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 - MRI of the brain without contrast was offered however nephew declines at this time given that he just wants to treat her for any reversible causes and keep her comfortable.  And given patient's state of agitation and confusion, patient will not be able to lay still for 30 to 45 minutes for an MRI scan - Haldol 1 mg IV every 6 hours as needed for agitation, 3 doses ordered - Fall precaution - Procalcitonin was elevated, initiated antibiotic with ceftriaxone and azithromycin - Admit to MedSurg, observation, telemetry  # Right hip pain-secondary to a fall - Lidocaine patch, 2 patches transdermal ordered - Ketorolac 7.5 mg IV every 6 hours as needed for moderate and severe pain, for 1 day - Avoiding opioids  # Failure to thrive # Debility - In the last few days, family has not been able to care for her fully and they do not want her to be discharged to a facility - [place] nephew at  bedside states that they would like for patient to have a hospital bed and home services in order for patient to be comfortable.  They want patient to have treatments including fluids and antibiotics and pain medication if needed. - TOC consulted  # Squamous cell carcinoma-status postbiopsy, using curettage and status post 5 fluorouracil injection - Outpatient follow-up with dermatology  Chart reviewed.   DVT prophylaxis: Enoxaparin  40 mg subcutaneous Code Status: Full code Diet: Regular diet Family Communication: Updated grand nephew at bedside Disposition Plan: Pending clinical course Consults called: TOC, PT, OT Admission status: MedSurg, observation, telemetry for 24 hours  Past Medical History:  Diagnosis Date   Breast cancer ([place]) 1986 and 2014   right breast ca   Breast cancer ([place]) 2000   left breast ca with mastectomy   Cancer ([place])    Complication of anesthesia    N/V   Dysrhythmia    Hyperlipidemia    Hypertension    Malignant neoplasm of breast (female), unspecified site December 2014   DCIS, 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   BREAST SURGERY Right 1986   LUMPECTOMY with reexcision. Completed in [place] Right 01/13/14   Right DCIS, no radiation therapy.   BUNIONECTOMY     CATARACT EXTRACTION W/PHACO Left 08/15/2015   Procedure: CATARACT EXTRACTION PHACO AND INTRAOCULAR LENS PLACEMENT (IOC);  Surgeon: Ronnell Freshwater, MD;  Location: [facility];  Service: Ophthalmology;  Laterality: Left;  C-PAP   MASTECTOMY Left 2000   TONSILLECTOMY     Social History:  reports that she quit smoking about 28 years ago. Her smoking use included cigarettes. She has never used smokeless tobacco. She reports that she does not drink alcohol and does not use drugs.  Allergies  Allergen Reactions   Codeine Nausea And Vomiting   Morphine And Related Nausea And Vomiting   No family history on file. Family history: Family history reviewed and not pertinent  Prior to Admission medications    Not on File   Physical Exam: Vitals:   09/21/21 1202 09/21/21 1701 09/21/21 1708 09/21/21 2032  BP: (!) 169/109 (!) 166/88  (!) 155/79  Pulse: 99 95  95  Resp: 18 17  16   Temp: 98.9 F (37.2 C)   97.9 F (36.6 C)  TempSrc: Oral   Oral  SpO2: 97% 98%  95%  Weight:   72.5 kg   Height:   4' 11"  (1.499 m)    Constitutional: appears age-appropriate, frail, cachectic appearing, NAD, calm, comfortable Eyes: PERRL, lids and conjunctivae normal ENMT: Mucous membranes are moist. Posterior pharynx clear of any exudate or lesions. Age-appropriate dentition. Hearing appropriate Neck: normal, supple, no masses, no thyromegaly Respiratory: clear to auscultation bilaterally, no wheezing, no crackles. Normal respiratory effort. No accessory muscle use.  Cardiovascular: Regular rate and rhythm, no murmurs / rubs / gallops. No extremity edema. 2+ pedal pulses. No carotid bruits.  Abdomen: no tenderness, no masses palpated, no hepatosplenomegaly. Bowel sounds positive.  Musculoskeletal:  no clubbing / cyanosis. No joint deformity upper and lower extremities. Good ROM, no contractures, no atrophy. Normal muscle tone.  Skin:  No induration.  Skin lesion changes on the scalp is present.  Negative for induration or bleeding on her scalp. Neurologic: Sensation intact. Strength 5/5 in all 4.  Psychiatric: Normal judgment and insight. Alert and oriented x 3. Normal mood.   EKG: independently reviewed, showing sinus rhythm with rate of 98, QTc 434, LVH  Chest x-ray on Admission: I personally reviewed and I agree with radiologist reading as below.  CT HEAD WO CONTRAST (5MM)  Result Date: 09/21/2021 CLINICAL DATA:  Trauma, fall EXAM: CT HEAD WITHOUT CONTRAST TECHNIQUE: Contiguous axial images were obtained from the base of the skull through the vertex without intravenous contrast. COMPARISON:  None. FINDINGS: Brain: There is no evidence of acute intracranial hemorrhage, extra-axial fluid collection, or acute  infarct. There is marked global parenchymal volume loss with enlargement of the ventricular system and extra-axial CSF spaces. Confluent hypodensity in the subcortical and periventricular white matter likely reflects sequela of chronic white matter microangiopathy. There is no mass lesion. There is no midline shift. Vascular: There is calcification of the bilateral cavernous ICAs. Skull: Normal. Negative for fracture or focal lesion. Sinuses/Orbits: The imaged paranasal sinuses are clear. A left lens implant is in place. The globes and orbits are otherwise unremarkable. Other: None. IMPRESSION: 1. No acute intracranial hemorrhage or calvarial fracture. 2. Global parenchymal volume loss and chronic white matter microangiopathy. Electronically Signed   By: Valetta Mole M.D.   On: 09/21/2021 11:41   CT Hip Right Wo Contrast  Result Date: 09/21/2021 CLINICAL DATA:  Fall. EXAM: CT OF THE RIGHT HIP WITHOUT CONTRAST TECHNIQUE: Multidetector CT imaging of the right hip was performed according to the standard protocol. Multiplanar CT image reconstructions were also generated. COMPARISON:  Right hip x-rays from same day. FINDINGS: Bones/Joint/Cartilage No fracture or dislocation. Mild right hip osteoarthritis. No joint effusion. Ligaments Ligaments are suboptimally evaluated by CT. Muscles and Tendons Grossly intact. Soft tissue No fluid collection or hematoma.  No soft tissue mass. IMPRESSION: 1. No acute osseous abnormality. 2. Mild right hip osteoarthritis. Electronically Signed   By: Titus Dubin M.D.   On: 09/21/2021 18:40   DG Chest Portable 1 View  Result Date: 09/21/2021 CLINICAL DATA:  Evaluate for pneumonia EXAM: PORTABLE CHEST 1 VIEW COMPARISON:  None. FINDINGS: The heart size and mediastinal contours are within normal limits. Bilateral peribronchial cuffing. No focal consolidation. The visualized skeletal structures are unremarkable. IMPRESSION: Bilateral peribronchial cuffing, findings can be seen in  the setting of bronchitis. No focal consolidation to suggest pneumonia. Electronically Signed   By: Yetta Glassman M.D.   On: 09/21/2021 14:56   DG Knee Complete 4 Views Right  Result Date: 09/21/2021 CLINICAL DATA:  Right knee pain.  Fall on Sunday. EXAM: RIGHT KNEE - COMPLETE 4+ VIEW COMPARISON:  None. FINDINGS: No fracture or dislocation. Mild tricompartmental osteoarthritis with peripheral spurring and tibiofemoral joint space narrowing. Bones are diffusely under mineralized. There is a small knee joint effusion but no lipohemarthrosis. Mild soft tissue edema. IMPRESSION: 1. No fracture or dislocation. 2. Mild tricompartmental osteoarthritis with small joint effusion. 3. Osteopenia/osteoporosis. Electronically Signed   By: Keith Rake M.D.   On: 09/21/2021 16:51   DG Hip Unilat W or Wo Pelvis 2-3 Views Right  Result Date: 09/21/2021 CLINICAL DATA:  Fall, right hip pain EXAM: DG HIP (WITH OR WITHOUT PELVIS) 2-3V RIGHT COMPARISON:  None. FINDINGS:  There is no evidence of hip fracture or dislocation. There is no evidence of arthropathy or other focal bone abnormality. IMPRESSION: Negative. Electronically Signed   By: Fidela Salisbury M.D.   On: 09/21/2021 12:17    Labs on Admission: I have personally reviewed following labs  CBC: Recent Labs  Lab 09/21/21 1206  WBC 9.1  HGB 14.1  HCT 42.9  MCV 99.5  PLT 553*   Basic Metabolic Panel: Recent Labs  Lab 09/21/21 1206  NA 141  K 3.8  CL 108  CO2 24  GLUCOSE 120*  BUN 20  CREATININE 1.25*  CALCIUM 8.6*   GFR: Estimated Creatinine Clearance: 23.9 mL/min (A) (by C-G formula based on SCr of 1.25 mg/dL (H)).  Liver Function Tests: Recent Labs  Lab 09/21/21 1206  AST 19  ALT 13  ALKPHOS 53  BILITOT 1.8*  PROT 6.8  ALBUMIN 3.1*   Dr. Tobie Poet [facility]  If 7PM-7AM, please contact overnight-coverage provider If 7AM-7PM, please contact day coverage provider [email or web address]  09/21/2021, 8:53 PM

## 2021-09-21 NOTE — ED Triage Notes (Signed)
Pt comes into the ED via EMS from home with c/o AMS for the past 2-3 days, fell on Sunday, hit her head, ambulatory after the fall.   80/66 98.6 axillary [id number]

## 2021-09-21 NOTE — Progress Notes (Addendum)
[facility] Progress Note - No charge  Reviewed labs that were ordered and procalcitonin is elevated at 0.60  Given patient's altered mentation, community-acquired pneumonia is a differential as the etiology causing patient to fall and altered  # Community acquired pneumonia-started patient on azithromycin and ceftriaxone IV - Came back to bedside to update family, great grand nephew, Jenny Reichmann at bedside - [place] grand nephew at bedside was agitated and did not want to listen to what I have to say. He accused me of  lying to him regarding bed placement and that patient is still awaiting in the hallway - I updated family regarding the new diagnosis and as a possible reversible etiology for her presentation  # Bed placement - I went to nursing staff desk inquiring about bed placement - ED secretary stated that patient now has a MedSurg floor bed and the room is currently being cleaned and the room is 109 - I updated John, great grand nephew at bedside appropriately  Dr. Tobie Poet

## 2021-09-21 NOTE — ED Notes (Signed)
Straight cath attempted by Primary RN and Float RN. Unsuccessful as pt was aggressive and grabbing primary RNs wrist during insertion. ED MD made aware.

## 2021-09-22 DIAGNOSIS — M25551 Pain in right hip: Secondary | ICD-10-CM | POA: Diagnosis present

## 2021-09-22 DIAGNOSIS — Z853 Personal history of malignant neoplasm of breast: Secondary | ICD-10-CM

## 2021-09-22 DIAGNOSIS — R627 Adult failure to thrive: Secondary | ICD-10-CM | POA: Diagnosis present

## 2021-09-22 DIAGNOSIS — Z20822 Contact with and (suspected) exposure to covid-19: Secondary | ICD-10-CM | POA: Diagnosis present

## 2021-09-22 DIAGNOSIS — R4182 Altered mental status, unspecified: Secondary | ICD-10-CM | POA: Diagnosis present

## 2021-09-22 DIAGNOSIS — R64 Cachexia: Secondary | ICD-10-CM | POA: Diagnosis present

## 2021-09-22 DIAGNOSIS — I10 Essential (primary) hypertension: Secondary | ICD-10-CM | POA: Diagnosis present

## 2021-09-22 DIAGNOSIS — Z6832 Body mass index (BMI) 32.0-32.9, adult: Secondary | ICD-10-CM | POA: Diagnosis not present

## 2021-09-22 DIAGNOSIS — J40 Bronchitis, not specified as acute or chronic: Secondary | ICD-10-CM

## 2021-09-22 DIAGNOSIS — Z885 Allergy status to narcotic agent status: Secondary | ICD-10-CM | POA: Diagnosis not present

## 2021-09-22 DIAGNOSIS — M25561 Pain in right knee: Secondary | ICD-10-CM | POA: Diagnosis present

## 2021-09-22 DIAGNOSIS — R5381 Other malaise: Secondary | ICD-10-CM | POA: Diagnosis not present

## 2021-09-22 DIAGNOSIS — E785 Hyperlipidemia, unspecified: Secondary | ICD-10-CM | POA: Diagnosis present

## 2021-09-22 DIAGNOSIS — Z87891 Personal history of nicotine dependence: Secondary | ICD-10-CM | POA: Diagnosis not present

## 2021-09-22 DIAGNOSIS — R54 Age-related physical debility: Secondary | ICD-10-CM | POA: Diagnosis present

## 2021-09-22 DIAGNOSIS — Z86007 Personal history of in-situ neoplasm of skin: Secondary | ICD-10-CM | POA: Diagnosis not present

## 2021-09-22 DIAGNOSIS — G9341 Metabolic encephalopathy: Secondary | ICD-10-CM | POA: Diagnosis present

## 2021-09-22 DIAGNOSIS — Z79899 Other long term (current) drug therapy: Secondary | ICD-10-CM | POA: Diagnosis not present

## 2021-09-22 DIAGNOSIS — Z9010 Acquired absence of unspecified breast and nipple: Secondary | ICD-10-CM | POA: Diagnosis not present

## 2021-09-22 DIAGNOSIS — E538 Deficiency of other specified B group vitamins: Secondary | ICD-10-CM | POA: Diagnosis present

## 2021-09-22 DIAGNOSIS — R404 Transient alteration of awareness: Secondary | ICD-10-CM | POA: Diagnosis not present

## 2021-09-22 DIAGNOSIS — Z9181 History of falling: Secondary | ICD-10-CM | POA: Diagnosis not present

## 2021-09-22 DIAGNOSIS — R262 Difficulty in walking, not elsewhere classified: Secondary | ICD-10-CM | POA: Diagnosis present

## 2021-09-22 LAB — CBC
HCT: 38.8 % (ref 36.0–46.0)
Hemoglobin: 13.3 g/dL (ref 12.0–15.0)
MCH: 34.1 pg — ABNORMAL HIGH (ref 26.0–34.0)
MCHC: 34.3 g/dL (ref 30.0–36.0)
MCV: 99.5 fL (ref 80.0–100.0)
Platelets: 122 10*3/uL — ABNORMAL LOW (ref 150–400)
RBC: 3.9 MIL/uL (ref 3.87–5.11)
RDW: 14.5 % (ref 11.5–15.5)
WBC: 9.6 10*3/uL (ref 4.0–10.5)
nRBC: 0 % (ref 0.0–0.2)

## 2021-09-22 LAB — BASIC METABOLIC PANEL
Anion gap: 11 (ref 5–15)
BUN: 26 mg/dL — ABNORMAL HIGH (ref 8–23)
CO2: 22 mmol/L (ref 22–32)
Calcium: 8.2 mg/dL — ABNORMAL LOW (ref 8.9–10.3)
Chloride: 108 mmol/L (ref 98–111)
Creatinine, Ser: 1.19 mg/dL — ABNORMAL HIGH (ref 0.44–1.00)
GFR, Estimated: 42 mL/min — ABNORMAL LOW (ref >60)
Glucose, Bld: 107 mg/dL — ABNORMAL HIGH (ref 70–99)
Potassium: 3.6 mmol/L (ref 3.5–5.1)
Sodium: 141 mmol/L (ref 135–145)

## 2021-09-22 LAB — VITAMIN B12: Vitamin B-12: 130 pg/mL — ABNORMAL LOW (ref 180–914)

## 2021-09-22 MED ORDER — ENOXAPARIN SODIUM 30 MG/0.3ML IJ SOSY
30.0000 mg | PREFILLED_SYRINGE | INTRAMUSCULAR | Status: DC
  Administered 2021-09-22: 21:00:00 30 mg via SUBCUTANEOUS
  Filled 2021-09-22: qty 0.3

## 2021-09-22 MED ORDER — CYANOCOBALAMIN 1000 MCG/ML IJ SOLN
1000.0000 ug | Freq: Once | INTRAMUSCULAR | Status: AC
  Administered 2021-09-22: 20:00:00 1000 ug via INTRAMUSCULAR
  Filled 2021-09-22: qty 1

## 2021-09-22 NOTE — Progress Notes (Signed)
Student reported to [place], RN elevated BP. Mardella Layman, RN

## 2021-09-22 NOTE — Evaluation (Addendum)
Physical Therapy Evaluation Patient Details Name: Kathleen Baker MRN: [id number] DOB: Oct 24, 1925 Today's Date: 09/22/2021  History of Present Illness  Kathleen Baker is a 87 y.o. 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 prescribed medications, currently taking 5-fluorouracil injections at dermatology clinic, presents to the emergency department from home for chief concerns of confusion.   Clinical Impression  Patient received in bed, she is somewhat confused, no family present. She is pleasant and follows direction. Patient reports knee pain with flexion. No ankle or hip pain. Patient requires max +2 assist for bed mobility at this time with poor sitting balance. Transfer not attempted. She will continue to benefit from skilled PT while here to improve functional independence and safety with mobility.            Recommendations for follow up therapy are one component of a multi-disciplinary discharge planning process, led by the attending physician.  Recommendations may be updated based on patient status, additional functional criteria and insurance authorization.  Follow Up Recommendations Supervision/Assistance - 24 hour;No PT follow up    Equipment Recommendations  Hospital bed    Recommendations for Other Services       Precautions / Restrictions Precautions Precautions: Fall Restrictions Weight Bearing Restrictions: No      Mobility  Bed Mobility Overal bed mobility: Needs Assistance Bed Mobility: Supine to Sit;Sit to Supine;Rolling Rolling: Max assist;+2 for physical assistance   Supine to sit: Max assist;+2 for physical assistance Sit to supine: Max assist;+2 for physical assistance   General bed mobility comments: patient fearful and limited by right knee pain    Transfers                 General transfer comment: not attempted  Ambulation/Gait              General Gait Details: not attempted  Stairs            Wheelchair Mobility    Modified Rankin (Stroke Patients Only)       Balance Overall balance assessment: Needs assistance   Sitting balance-Leahy Scale: Fair Sitting balance - Comments: patient requires min assist for static sitting balance                                     Pertinent Vitals/Pain Pain Assessment: Faces Faces Pain Scale: Hurts whole lot Pain Location: R knee with bending Pain Descriptors / Indicators: Discomfort;Grimacing;Guarding;Sore Pain Intervention(s): Limited activity within patient's tolerance;Monitored during session;Repositioned    Home Living Family/patient expects to be discharged to:: Private residence Living Arrangements: Other relatives Available Help at Discharge: Family;Available 24 hours/day Type of Home: House Home Access: Level entry     Home Layout: One level Home Equipment: Wheelchair - manual Additional Comments: unsure about home environment as patient is poor historian, per niece, she requires bed level assist. Family get her up to wheelchair.    Prior Function Level of Independence: Needs assistance   Gait / Transfers Assistance Needed: patient reports she does not use AD- uses wheelchair per niece  ADL's / Homemaking Assistance Needed: reports she gets assistance for showering        Hand Dominance        Extremity/Trunk Assessment   Upper Extremity Assessment Upper Extremity Assessment: Defer to OT evaluation    Lower Extremity Assessment Lower Extremity Assessment:  Generalized weakness;RLE deficits/detail RLE: Unable to fully assess due to pain RLE Sensation: WNL RLE Coordination: decreased gross motor    Cervical / Trunk Assessment Cervical / Trunk Assessment: Kyphotic  Communication   Communication: No difficulties  Cognition Arousal/Alertness: Awake/alert Behavior During Therapy: WFL for tasks  assessed/performed Overall Cognitive Status: No family/caregiver present to determine baseline cognitive functioning                                 General Comments: patient confused      General Comments      Exercises     Assessment/Plan    PT Assessment Patient needs continued PT services  PT Problem List Decreased strength;Decreased mobility;Decreased activity tolerance;Decreased balance;Decreased cognition;Decreased coordination;Pain;Obesity       PT Treatment Interventions DME instruction;Therapeutic activities;Gait training;Therapeutic exercise;Stair training;Functional mobility training;Balance training;Patient/family education    PT Goals (Current goals can be found in the Care Plan section)  Acute Rehab PT Goals Patient Stated Goal: none stated PT Goal Formulation: Patient unable to participate in goal setting Time For Goal Achievement: 09/29/21    Frequency Min 2X/week   Barriers to discharge Inaccessible home environment;Decreased caregiver support no barriers to discharge    Co-evaluation               AM-PAC PT "6 Clicks" Mobility  Outcome Measure Help needed turning from your back to your side while in a flat bed without using bedrails?: A Lot Help needed moving from lying on your back to sitting on the side of a flat bed without using bedrails?: A Lot Help needed moving to and from a bed to a chair (including a wheelchair)?: Total Help needed standing up from a chair using your arms (e.g., wheelchair or bedside chair)?: Total Help needed to walk in hospital room?: Total Help needed climbing 3-5 steps with a railing? : Total 6 Click Score: 8    End of Session   Activity Tolerance: Patient limited by lethargy;Patient limited by pain Patient left: in bed;with call bell/phone within reach;Other (comment) (patient left with OT in room) Nurse Communication: Mobility status PT Visit Diagnosis: Other abnormalities of gait and mobility  (R26.89);Repeated falls (R29.6);Muscle weakness (generalized) (M62.81);Pain Pain - Right/Left: Right Pain - part of body: Knee    Time: 3903-0092 PT Time Calculation (min) (ACUTE ONLY): 24 min   Charges:   PT Evaluation $PT Eval Moderate Complexity: 1 Mod PT Treatments $Therapeutic Activity: 8-22 mins        Lathen Seal, PT, GCS 09/22/21,11:35 AM

## 2021-09-22 NOTE — Evaluation (Signed)
Occupational Therapy Evaluation Patient Details Name: Kathleen Baker MRN: [id number] DOB: 09/21/1925 Today's Date: 09/22/2021   History of Present Illness Kathleen Baker is a 87 y.o. 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 prescribed medications, currently taking 5-fluorouracil injections at dermatology clinic, presents to the emergency department from home for chief concerns of confusion.   Clinical Impression   Patient presenting with decreased Ind in self care, balance, functional mobility/transfers, endurance, and safety awareness. Patient reports being fully independent PTA. OT called niece who reports being pt's caregiver. Pt needing assistance for bathing and dressing from bed level at baseline. She transfers into wheelchair during the day for safety. She has been in wheelchair most recently but prior to that used RW for mobility. Pt is cognitively impaired at baseline and needs mod - max multimodal cuing for safety awareness and redirection. Pt needing +2 assistance for bed mobility and increased pain in R knee. Pt refused standing or further OOB activities. Balance on EOB ranged from min - max A secondary to impulsive behavior from pt. Pt's niece requesting she return home with support.Patient will benefit from acute OT to increase overall independence in the areas of ADLs, functional mobility, and safety awareness in order to safely discharge home with family.     Recommendations for follow up therapy are one component of a multi-disciplinary discharge planning process, led by the attending physician.  Recommendations may be updated based on patient status, additional functional criteria and insurance authorization.   Follow Up Recommendations  Home health OT;Supervision/Assistance - 24 hour    Equipment Recommendations  Hospital bed , 3 in 1 commode chair      Precautions /  Restrictions Precautions Precautions: Fall Restrictions Weight Bearing Restrictions: No      Mobility Bed Mobility Overal bed mobility: Needs Assistance Bed Mobility: Supine to Sit;Sit to Supine;Rolling Rolling: Max assist;+2 for physical assistance   Supine to sit: Max assist;+2 for physical assistance Sit to supine: Max assist;+2 for physical assistance   General bed mobility comments: patient fearful and limited by right knee pain    Transfers                 General transfer comment: not attempted    Balance Overall balance assessment: Needs assistance   Sitting balance-Leahy Scale: Poor Sitting balance - Comments: min - max A. Pt very impulsive with movement.                                   ADL either performed or assessed with clinical judgement   ADL Overall ADL's : Needs assistance/impaired     Grooming: Wash/dry hands;Wash/dry face;Supervision/safety;Set up;Cueing for sequencing;Cueing for safety Grooming Details (indicate cue type and reason): min - max A for sitting balance                                     Vision Patient Visual Report: No change from baseline              Pertinent Vitals/Pain Pain Assessment: Faces Faces Pain Scale: Hurts even more Pain Location: R knee with bending Pain Descriptors / Indicators: Discomfort;Grimacing;Guarding;Sore Pain Intervention(s): Limited activity within patient's tolerance;Monitored during session;Repositioned     Hand Dominance Right   Extremity/Trunk Assessment Upper Extremity  Assessment Upper Extremity Assessment: Generalized weakness   Lower Extremity Assessment Lower Extremity Assessment: Defer to PT evaluation RLE: Unable to fully assess due to pain RLE Sensation: WNL RLE Coordination: decreased gross motor   Cervical / Trunk Assessment Cervical / Trunk Assessment: Kyphotic   Communication Communication Communication: No difficulties   Cognition  Arousal/Alertness: Awake/alert Behavior During Therapy: Impulsive Overall Cognitive Status: History of cognitive impairments - at baseline                                 General Comments: Pt with baseline dementia per chart review.              Home Living Family/patient expects to be discharged to:: Private residence Living Arrangements: Other relatives Available Help at Discharge: Family;Available 24 hours/day Type of Home: House Home Access: Level entry     Home Layout: One level               Home Equipment: Wheelchair - [employer] - 2 wheels   Additional Comments: OT called niece to confirm baseline.      Prior Functioning/Environment Level of Independence: Needs assistance  Gait / Transfers Assistance Needed: Caregiver reports, she has been in wheelchair during the day for safety but was using RW until recently. ADL's / Homemaking Assistance Needed: Niece assists pt with bed bathing and dressing at baseline            OT Problem List: Decreased strength;Decreased knowledge of use of DME or AE;Decreased activity tolerance;Impaired balance (sitting and/or standing);Decreased safety awareness;Pain;Decreased cognition      OT Treatment/Interventions: Self-care/ADL training;Therapeutic exercise;Energy conservation;DME and/or AE instruction;Therapeutic activities;Cognitive remediation/compensation;Balance training;Patient/family education;Modalities;Manual therapy    OT Goals(Current goals can be found in the care plan section) Acute Rehab OT Goals Patient Stated Goal: none stated OT Goal Formulation: With patient Time For Goal Achievement: 10/06/21 Potential to Achieve Goals: Fair ADL Goals Pt Will Perform Grooming: with supervision;sitting Pt Will Perform Lower Body Dressing: with min assist Pt Will Transfer to Toilet: with min assist;ambulating;bedside commode Pt Will Perform Toileting - Clothing Manipulation and hygiene: with min  assist;sit to/from stand  OT Frequency: Min 2X/week   Barriers to D/C:    none known at this time          AM-PAC OT "6 Clicks" Daily Activity     Outcome Measure Help from another person eating meals?: A Little Help from another person taking care of personal grooming?: A Little Help from another person toileting, which includes using toliet, bedpan, or urinal?: Total Help from another person bathing (including washing, rinsing, drying)?: A Lot Help from another person to put on and taking off regular upper body clothing?: A Little Help from another person to put on and taking off regular lower body clothing?: A Lot 6 Click Score: 14   End of Session Nurse Communication: Mobility status  Activity Tolerance: Patient limited by pain Patient left: in bed;with call bell/phone within reach;with bed alarm set  OT Visit Diagnosis: Unsteadiness on feet (R26.81);Repeated falls (R29.6);Muscle weakness (generalized) (M62.81)                Time: 1030-1055 OT Time Calculation (min): 25 min Charges:  OT General Charges $OT Visit: 1 Visit OT Evaluation $OT Eval Moderate Complexity: 1 Mod OT Treatments $Self Care/Home Management : 8-22 mins  Darleen Crocker, MS, OTR/L , CBIS ascom [phone]  09/22/21, 12:00 PM

## 2021-09-22 NOTE — Progress Notes (Signed)
PHARMACIST - PHYSICIAN COMMUNICATION  CONCERNING:  Enoxaparin (Lovenox) for DVT Prophylaxis   DESCRIPTION: Patient was prescribed enoxaprin 40mg  q24 hours for VTE prophylaxis.   Filed Weights   09/21/21 1708  Weight: 72.5 kg (159 lb 13.3 oz)    Body mass index is 32.28 kg/m.  Estimated Creatinine Clearance: 25.1 mL/min (A) (by C-G formula based on SCr of 1.19 mg/dL (H)).  Patient is candidate for enoxaparin 30mg  every 24 hours based on CrCl <35ml/min or Weight <45kg  RECOMMENDATION: Pharmacy has adjusted enoxaparin dose per [facility] policy.  Patient is now receiving enoxaparin 30 mg every 24 hours    Darnelle Bos, PharmD Clinical Pharmacist  09/22/2021 8:29 AM

## 2021-09-22 NOTE — Progress Notes (Addendum)
PROGRESS NOTE  Kathleen Baker CWU:[id number] DOB: 1925-02-20 DOA: 09/21/2021 PCP: Perrin Maltese, MD   LOS: 0 days   Brief narrative:  Kathleen Baker is a 87 y.o. female with past medical history of squamous cell carcinoma of the scalp, actinic keratoses presented to hospital with complaints of confusion and mumbling.  Patient had a fall almost 4 days back after which she was having difficulty walking and was complaining of leg pain.  In the ED, patient was noted to have stable vitals.  Creatinine 1.2.  Patient was then admitted to the hospital for further evaluation and treatment.   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  Procalcitonin was 0.6.  Chest x-ray showed no focal consolidation.  MRI was offered but family did not wish to pursue on it Since today been difficult for her to lie still.  Was put on Haldol as needed for agitation.  Vitamin b12 low will supplement.  Possible bronchitis.  Currently on Rocephin and Zithromax.  Right hip pain.  Secondary to fall.  Continue lidocaine patch Toradol.  Try to avoid narcotics.   Failure to thrive/debility Family wishes PT OT evaluation but does not wish skilled nursing facility on discharge.  Squamous cell carcinoma of the scalp.-Status post biopsy and curettage with 5-FU injection.  Follow-up with dermatology as outpatient   DVT prophylaxis: enoxaparin (LOVENOX) injection 30 mg Start: 09/22/21 2200 Place TED hose Start: 09/21/21 1952   Code Status: Full code  Family Communication: Spoke with the patient's niece and nephew at bedside.  Status is: Inpatient  Remains inpatient appropriate because:Unsafe d/c plan, IV  treatments appropriate due to intensity of illness or inability to take PO, and Inpatient level of care appropriate due to severity of illness  Dispo: The patient is from: Home              Anticipated d/c is to:  Home with home health.  Family does not wish her to go to skilled nursing facility.              Patient currently is not medically stable to d/c.   Difficult to place patient No  Consultants: None  Procedures: None  Anti-infectives:  Azithromycin IV Rocephin IV  Anti-infectives (From admission, onward)    Start     Dose/Rate Route Frequency Ordered Stop   09/21/21 2200  azithromycin (ZITHROMAX) 500 mg in sodium chloride 0.9 % 250 mL IVPB        500 mg 250 mL/hr over 60 Minutes Intravenous Every 24 hours 09/21/21 2148 09/24/21 2159   09/21/21 2200  cefTRIAXone (ROCEPHIN) 1 g in sodium chloride 0.9 % 100 mL IVPB        1 g 200 mL/hr over 30 Minutes Intravenous Every 24 hours 09/21/21 2148 09/26/21 2159      Subjective: Today, patient was seen and examined at bedside.  Complains of mild right leg pain.  Poor historian.  Patient's niece and nephew at bedside  Objective: Vitals:   09/22/21 0735 09/22/21 1102  BP: 134/75 (!) 149/62  Pulse: 90 93  Resp:  17  Temp: 98.6 F (37 C) 98.4 F (36.9 C)  SpO2: 100%     Intake/Output Summary (  Last 24 hours) at 09/22/2021 1334 Last data filed at 09/22/2021 0300 Gross per 24 hour  Intake 350.16 ml  Output --  Net 350.16 ml   Filed Weights   09/21/21 1708  Weight: 72.5 kg   Body mass index is 32.28 kg/m.   Physical Exam: GENERAL: Patient is alert awake and communicative, pleasantly confused, oriented to person only, not in obvious distress.,  Frail appearing. HENT: No scleral pallor or icterus. Pupils equally reactive to light. Oral mucosa is mildly dry.  Erythema of the scalp. NECK: is supple, no gross swelling noted. CHEST: Clear to auscultation. No crackles or wheezes.  Diminished breath sounds  bilaterally. CVS: S1 and S2 heard, no murmur. Regular rate and rhythm.  ABDOMEN: Soft, non-tender, bowel sounds are present. EXTREMITIES: No edema.  Moving extremities.  Tenderness on movement of the right hip.  No external bruise noted. CNS: Cranial nerves are intact.  Moving extremities, oriented to person SKIN: warm and dry without rashes.  Erythema of the scalp.  Data Review: I have personally reviewed the following laboratory data and studies,  CBC: Recent Labs  Lab 09/21/21 1206 09/22/21 0638  WBC 9.1 9.6  HGB 14.1 13.3  HCT 42.9 38.8  MCV 99.5 99.5  PLT 100* 403*   Basic Metabolic Panel: Recent Labs  Lab 09/21/21 1206 09/22/21 0638  NA 141 141  K 3.8 3.6  CL 108 108  CO2 24 22  GLUCOSE 120* 107*  BUN 20 26*  CREATININE 1.25* 1.19*  CALCIUM 8.6* 8.2*   Liver Function Tests: Recent Labs  Lab 09/21/21 1206  AST 19  ALT 13  ALKPHOS 53  BILITOT 1.8*  PROT 6.8  ALBUMIN 3.1*   No results for input(s): LIPASE, AMYLASE in the last 168 hours. No results for input(s): AMMONIA in the last 168 hours. Cardiac Enzymes: No results for input(s): CKTOTAL, CKMB, CKMBINDEX, TROPONINI in the last 168 hours. BNP (last 3 results) No results for input(s): BNP in the last 8760 hours.  ProBNP (last 3 results) No results for input(s): PROBNP in the last 8760 hours.  CBG: No results for input(s): GLUCAP in the last 168 hours. Recent Results (from the past 240 hour(s))  Resp Panel by RT-PCR (Flu A&B, Covid) Nasopharyngeal Swab     Status: None   Collection Time: 09/21/21  4:09 PM   Specimen: Nasopharyngeal Swab; Nasopharyngeal(NP) swabs in vial transport medium  Result Value Ref Range Status   SARS Coronavirus 2 by RT PCR NEGATIVE NEGATIVE Final    Comment: (NOTE) SARS-CoV-2 target nucleic acids are NOT DETECTED.  The SARS-CoV-2 RNA is generally detectable in upper respiratory specimens during the acute phase of infection. The lowest concentration of SARS-CoV-2 viral  copies this assay can detect is 138 copies/mL. A negative result does not preclude SARS-Cov-2 infection and should not be used as the sole basis for treatment or other patient management decisions. A negative result may occur with  improper specimen collection/handling, submission of specimen other than nasopharyngeal swab, presence of viral mutation(s) within the areas targeted by this assay, and inadequate number of viral copies(<138 copies/mL). A negative result must be combined with clinical observations, patient history, and epidemiological information. The expected result is Negative.  Fact Sheet for Patients:  EntrepreneurPulse.com.au  Fact Sheet for Healthcare Providers:  IncredibleEmployment.be  This test is no t yet approved or cleared by the [place] FDA and  has been authorized for detection and/or diagnosis of SARS-CoV-2 by FDA under an Emergency Use Authorization (EUA). This  EUA will remain  in effect (meaning this test can be used) for the duration of the COVID-19 declaration under Section 564(b)(1) of the Act, 21 U.S.C.section 360bbb-3(b)(1), unless the authorization is terminated  or revoked sooner.       Influenza A by PCR NEGATIVE NEGATIVE Final   Influenza B by PCR NEGATIVE NEGATIVE Final    Comment: (NOTE) The Xpert Xpress SARS-CoV-2/FLU/RSV plus assay is intended as an aid in the diagnosis of influenza from Nasopharyngeal swab specimens and should not be used as a sole basis for treatment. Nasal washings and aspirates are unacceptable for Xpert Xpress SARS-CoV-2/FLU/RSV testing.  Fact Sheet for Patients: EntrepreneurPulse.com.au  Fact Sheet for Healthcare Providers: IncredibleEmployment.be  This test is not yet approved or cleared by the [place] FDA and has been authorized for detection and/or diagnosis of SARS-CoV-2 by FDA under an Emergency Use Authorization (EUA). This  EUA will remain in effect (meaning this test can be used) for the duration of the COVID-19 declaration under Section 564(b)(1) of the Act, 21 U.S.C. section 360bbb-3(b)(1), unless the authorization is terminated or revoked.  Performed at [facility], [address] [place], [place] [address]      Studies: CT HEAD WO CONTRAST (5MM)  Result Date: 09/21/2021 CLINICAL DATA:  Trauma, fall EXAM: CT HEAD WITHOUT CONTRAST TECHNIQUE: Contiguous axial images were obtained from the base of the skull through the vertex without intravenous contrast. COMPARISON:  None. FINDINGS: Brain: There is no evidence of acute intracranial hemorrhage, extra-axial fluid collection, or acute infarct. There is marked global parenchymal volume loss with enlargement of the ventricular system and extra-axial CSF spaces. Confluent hypodensity in the subcortical and periventricular white matter likely reflects sequela of chronic white matter microangiopathy. There is no mass lesion. There is no midline shift. Vascular: There is calcification of the bilateral cavernous ICAs. Skull: Normal. Negative for fracture or focal lesion. Sinuses/Orbits: The imaged paranasal sinuses are clear. A left lens implant is in place. The globes and orbits are otherwise unremarkable. Other: None. IMPRESSION: 1. No acute intracranial hemorrhage or calvarial fracture. 2. Global parenchymal volume loss and chronic white matter microangiopathy. Electronically Signed   By: Valetta Mole M.D.   On: 09/21/2021 11:41   CT Hip Right Wo Contrast  Result Date: 09/21/2021 CLINICAL DATA:  Fall. EXAM: CT OF THE RIGHT HIP WITHOUT CONTRAST TECHNIQUE: Multidetector CT imaging of the right hip was performed according to the standard protocol. Multiplanar CT image reconstructions were also generated. COMPARISON:  Right hip x-rays from same day. FINDINGS: Bones/Joint/Cartilage No fracture or dislocation. Mild right hip osteoarthritis. No joint effusion.  Ligaments Ligaments are suboptimally evaluated by CT. Muscles and Tendons Grossly intact. Soft tissue No fluid collection or hematoma.  No soft tissue mass. IMPRESSION: 1. No acute osseous abnormality. 2. Mild right hip osteoarthritis. Electronically Signed   By: Titus Dubin M.D.   On: 09/21/2021 18:40   DG Chest Portable 1 View  Result Date: 09/21/2021 CLINICAL DATA:  Evaluate for pneumonia EXAM: PORTABLE CHEST 1 VIEW COMPARISON:  None. FINDINGS: The heart size and mediastinal contours are within normal limits. Bilateral peribronchial cuffing. No focal consolidation. The visualized skeletal structures are unremarkable. IMPRESSION: Bilateral peribronchial cuffing, findings can be seen in the setting of bronchitis. No focal consolidation to suggest pneumonia. Electronically Signed   By: Yetta Glassman M.D.   On: 09/21/2021 14:56   DG Knee Complete 4 Views Right  Result Date: 09/21/2021 CLINICAL DATA:  Right knee pain.  Fall on Sunday. EXAM:  RIGHT KNEE - COMPLETE 4+ VIEW COMPARISON:  None. FINDINGS: No fracture or dislocation. Mild tricompartmental osteoarthritis with peripheral spurring and tibiofemoral joint space narrowing. Bones are diffusely under mineralized. There is a small knee joint effusion but no lipohemarthrosis. Mild soft tissue edema. IMPRESSION: 1. No fracture or dislocation. 2. Mild tricompartmental osteoarthritis with small joint effusion. 3. Osteopenia/osteoporosis. Electronically Signed   By: Keith Rake M.D.   On: 09/21/2021 16:51   DG Hip Unilat W or Wo Pelvis 2-3 Views Right  Result Date: 09/21/2021 CLINICAL DATA:  Fall, right hip pain EXAM: DG HIP (WITH OR WITHOUT PELVIS) 2-3V RIGHT COMPARISON:  None. FINDINGS: There is no evidence of hip fracture or dislocation. There is no evidence of arthropathy or other focal bone abnormality. IMPRESSION: Negative. Electronically Signed   By: Fidela Salisbury M.D.   On: 09/21/2021 12:17      Flora Lipps, MD  [facility] 09/22/2021  If 7PM-7AM, please contact night-coverage

## 2021-09-22 NOTE — TOC Progression Note (Signed)
Transition of Care [facility]) - Progression Note    Patient Details  Name: Kathleen Baker MRN: [id number] Date of Birth: [birth year]  Transition of Care [facility]) CM/SW [place], RN Phone Number: 09/22/2021, 1:41 PM  Clinical Narrative:   Patient plans to return home with family after discharge tomorrow.  Family requests hospital bed, MD and [facility] aware.  Jason from [facility] notified of [place] need.           Expected Discharge Plan and Services                                                 Social Determinants of Health (SDOH) Interventions    Readmission Risk Interventions No flowsheet data found.

## 2021-09-23 DIAGNOSIS — M25561 Pain in right knee: Secondary | ICD-10-CM

## 2021-09-23 LAB — CBC
HCT: 39.2 % (ref 36.0–46.0)
Hemoglobin: 12.9 g/dL (ref 12.0–15.0)
MCH: 33.2 pg (ref 26.0–34.0)
MCHC: 32.9 g/dL (ref 30.0–36.0)
MCV: 100.8 fL — ABNORMAL HIGH (ref 80.0–100.0)
Platelets: 121 10*3/uL — ABNORMAL LOW (ref 150–400)
RBC: 3.89 MIL/uL (ref 3.87–5.11)
RDW: 14.5 % (ref 11.5–15.5)
WBC: 6.4 10*3/uL (ref 4.0–10.5)
nRBC: 0 % (ref 0.0–0.2)

## 2021-09-23 LAB — BASIC METABOLIC PANEL
Anion gap: 8 (ref 5–15)
BUN: 28 mg/dL — ABNORMAL HIGH (ref 8–23)
CO2: 25 mmol/L (ref 22–32)
Calcium: 8.1 mg/dL — ABNORMAL LOW (ref 8.9–10.3)
Chloride: 108 mmol/L (ref 98–111)
Creatinine, Ser: 1.11 mg/dL — ABNORMAL HIGH (ref 0.44–1.00)
GFR, Estimated: 46 mL/min — ABNORMAL LOW (ref >60)
Glucose, Bld: 90 mg/dL (ref 70–99)
Potassium: 3.4 mmol/L — ABNORMAL LOW (ref 3.5–5.1)
Sodium: 141 mmol/L (ref 135–145)

## 2021-09-23 LAB — MAGNESIUM: Magnesium: 2 mg/dL (ref 1.7–2.4)

## 2021-09-23 MED ORDER — VITAMIN B-12 1000 MCG PO TABS: 1000.0000 ug | ORAL_TABLET | Freq: Every day | ORAL | 2 refills | Status: AC

## 2021-09-23 MED ORDER — DOXYCYCLINE HYCLATE 100 MG PO TABS: 100.0000 mg | ORAL_TABLET | Freq: Two times a day (BID) | ORAL | 0 refills | Status: AC

## 2021-09-23 MED ORDER — LIDOCAINE 5 % EX PTCH: 2.0000 | MEDICATED_PATCH | CUTANEOUS | 0 refills | Status: AC

## 2021-09-23 NOTE — Progress Notes (Signed)
EMS present for pt discharge; discharge packet given to EMS personnel to take to the pt's house; pt discharged via stretcher by EMS personnel to the pt's home with home health

## 2021-09-23 NOTE — Progress Notes (Signed)
RN stated this pt to be discharged. No IV access needed at this time.

## 2021-09-23 NOTE — Progress Notes (Addendum)
MD order received in [facility] to discharge pt home today; TOC previously spoke with pt's great nephew at the pt's bedside (see separate note); TOC arranged discharge via [facility] ; pt's discharge pending arrival of EMS for nonemergency discharge; discharge packet prepared by [facility]; AVS printed and placed in discharge packet along with Rx for Doxycycline

## 2021-09-23 NOTE — TOC Progression Note (Addendum)
Transition of Care [facility]) - Progression Note    Patient Details  Name: Kathleen Baker MRN: [id number] Date of Birth: 1925-11-20  Transition of Care [facility]) CM/SW [place], RN Phone Number: 09/23/2021, 11:55 AM  Clinical Narrative:   Patient scheduled to discharge home today.  EMS contacted for transport home, 2-3rd on list.    Niece was not responding to calls for hospital bed from [place] or from [facility].  Nephew at bedside.  He states that patient will be returning to her residence, where they have a bed set up.  They would like to accept hospital bed on Tuesday, [facility] notified.  Nephew states that they have walker, BSC, linens at home, and they do not need DME at this time.  Nephew states he is able to pickup patient's medications and will be able to transport her to her follow up appointments.  Nephew has no concerns taking patient home today  Addendum:  patient is set up with [facility] home health, cheryl rose notified of patient's discharge today.       Expected Discharge Plan and Services           Expected Discharge Date: 09/23/21                                     Social Determinants of Health (SDOH) Interventions    Readmission Risk Interventions No flowsheet data found.

## 2021-09-23 NOTE — Plan of Care (Signed)
New IV establishment attempted x2 in right arm by this nurse, unsuccessfully. Patient tolerated well and was a very good sport. I told Mrs Kathleen Baker that the vascular team will be notified to come start her IV.  Will notify oncoming day nurse that patient needs an IV.  *Restricted Extremity use to left arm Hx mastectomy. Pink sleeve in place.

## 2021-09-23 NOTE — Plan of Care (Signed)
Notified oncoming day nurse Robyn of need for IV access- this nurse attempted x2

## 2021-09-23 NOTE — Progress Notes (Signed)
Verbally reviewed AVS with the pt's great nephew; Rx for Doxycycline will need to be taken to a pharmacy to be filled as directed; great nephew ? Home Health services for the pt; to inquire with TOC about [facility] OT services as recommended in their evaluation; [facility] was not recommended by PT in their evaluation note; sent secure chat to [facility], [place] ? [facility]; advised great nephew that I would let him know as soon as I receive a response; pt's discharge pending arrival of EMS for discharge home

## 2021-09-23 NOTE — Discharge Summary (Signed)
Physician Discharge Summary  ANIJAH SPOHR Baker:[id number] DOB: May 24, 1925 DOA: 09/21/2021  PCP: Kathleen Maltese, MD  Admit date: 09/21/2021 Discharge date: 09/23/2021  Admitted From: Home  Discharge disposition: Home PT  Recommendations for Outpatient Follow-Up:   Follow up with your primary care provider in one week.  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  Diet recommendation:   Regular.  Wound care: None.  Code status: Full.   History of Present Illness:   Kathleen Baker is a 87 y.o. female with past medical history of squamous cell carcinoma of the scalp, actinic keratoses presented to hospital with complaints of confusion and mumbling.  Patient had a fall almost 4 days back after which she was having difficulty walking and was complaining of leg pain.  In the ED, patient was noted to have stable vitals.  Creatinine 1.2.  Patient was then admitted to the hospital for further evaluation and treatment.  Hospital Course:   Following conditions were addressed during hospitalization as listed below,  Altered mental status with agitation and behavioral disturbances likely metabolic encephalopathy  Procalcitonin was 0.6.  Chest x-ray showed no focal consolidation.  MRI was offered but family did not wish to pursue on it  since it would have been difficult for her to lie still.   Vitamin b12 low will supplement.  Received 1 dose of IM vitamin B12 yesterday.  Will continue oral vitamin B12 on discharge.   Possible bronchitis.  Received Rocephin and Zithromax.  Continue doxycycline on discharge.   Right hip pain.  Secondary to fall.  Continue lidocaine patch on discharge.  Try to avoid  narcotics.   Failure to thrive/debility Family wishes PT OT evaluation but does not wish skilled nursing facility on discharge.  Will get home health on discharge   Squamous cell carcinoma of the scalp.-Status post biopsy and curettage with 5-FU injection.  Follow-up with dermatology as outpatient   Disposition.  At this time, patient is stable for disposition home with home health.  Spoke with the patient's niece prior to disposition.  Medical Consultants:   None.  Procedures:    None Subjective:   Today, patient was seen and examined at bedside. Patient denies interval complaints.  Denies any nausea, vomiting fever chills.  Discharge Exam:   Vitals:   09/23/21 0439 09/23/21 0800  BP: 108/61 (!) 160/124  Pulse: 88 92  Resp: 18 18  Temp: (!) 97.5 F (36.4 C)   SpO2: 99% 100%   Vitals:   09/22/21 1558 09/22/21 2032 09/23/21 0439 09/23/21 0800  BP: (!) 161/78 (!) 157/71 108/61 (!) 160/124  Pulse: 92 (!) 104 88 92  Resp: 16 18 18 18   Temp: 97.8 F (36.6 C) 98.2 F (36.8 C) (!) 97.5 F (36.4 C)   TempSrc:  Axillary    SpO2: 97% 100% 99% 100%  Weight:      Height:       General: Alert awake, not in obvious distress, pleasantly confused, frail. HENT: pupils equally reacting to light,  No scleral pallor or icterus noted. Oral mucosa is moist.  Chest:  Clear breath sounds.  Diminished breath sounds bilaterally. No crackles or wheezes.  CVS: S1 &S2  heard. No murmur.  Regular rate and rhythm. Abdomen: Soft, nontender, nondistended.  Bowel sounds are heard.   Extremities: No cyanosis, clubbing or edema.  Peripheral pulses are palpable.  Tenderness over the right hip. Psych: Alert, awake and oriented, normal mood CNS:  No cranial nerve deficits.  Moves all extremities, oriented to person Skin: Warm and dry.  Erythema of the scalp  The results of significant diagnostics from this hospitalization (including imaging, microbiology, ancillary and laboratory) are listed below  for reference.     Diagnostic Studies:   CT HEAD WO CONTRAST (5MM)  Result Date: 09/21/2021 CLINICAL DATA:  Trauma, fall EXAM: CT HEAD WITHOUT CONTRAST TECHNIQUE: Contiguous axial images were obtained from the base of the skull through the vertex without intravenous contrast. COMPARISON:  None. FINDINGS: Brain: There is no evidence of acute intracranial hemorrhage, extra-axial fluid collection, or acute infarct. There is marked global parenchymal volume loss with enlargement of the ventricular system and extra-axial CSF spaces. Confluent hypodensity in the subcortical and periventricular white matter likely reflects sequela of chronic white matter microangiopathy. There is no mass lesion. There is no midline shift. Vascular: There is calcification of the bilateral cavernous ICAs. Skull: Normal. Negative for fracture or focal lesion. Sinuses/Orbits: The imaged paranasal sinuses are clear. A left lens implant is in place. The globes and orbits are otherwise unremarkable. Other: None. IMPRESSION: 1. No acute intracranial hemorrhage or calvarial fracture. 2. Global parenchymal volume loss and chronic white matter microangiopathy. Electronically Signed   By: Valetta Mole M.D.   On: 09/21/2021 11:41   CT Hip Right Wo Contrast  Result Date: 09/21/2021 CLINICAL DATA:  Fall. EXAM: CT OF THE RIGHT HIP WITHOUT CONTRAST TECHNIQUE: Multidetector CT imaging of the right hip was performed according to the standard protocol. Multiplanar CT image reconstructions were also generated. COMPARISON:  Right hip x-rays from same day. FINDINGS: Bones/Joint/Cartilage No fracture or dislocation. Mild right hip osteoarthritis. No joint effusion. Ligaments Ligaments are suboptimally evaluated by CT. Muscles and Tendons Grossly intact. Soft tissue No fluid collection or hematoma.  No soft tissue mass. IMPRESSION: 1. No acute osseous abnormality. 2. Mild right hip osteoarthritis. Electronically Signed   By: Titus Dubin M.D.   On:  09/21/2021 18:40   DG Chest Portable 1 View  Result Date: 09/21/2021 CLINICAL DATA:  Evaluate for pneumonia EXAM: PORTABLE CHEST 1 VIEW COMPARISON:  None. FINDINGS: The heart size and mediastinal contours are within normal limits. Bilateral peribronchial cuffing. No focal consolidation. The visualized skeletal structures are unremarkable. IMPRESSION: Bilateral peribronchial cuffing, findings can be seen in the setting of bronchitis. No focal consolidation to suggest pneumonia. Electronically Signed   By: Yetta Glassman M.D.   On: 09/21/2021 14:56   DG Knee Complete 4 Views Right  Result Date: 09/21/2021 CLINICAL DATA:  Right knee pain.  Fall on Sunday. EXAM: RIGHT KNEE - COMPLETE 4+ VIEW COMPARISON:  None. FINDINGS: No fracture or dislocation. Mild tricompartmental osteoarthritis with peripheral spurring and tibiofemoral joint space narrowing. Bones are diffusely under mineralized. There is a small knee joint effusion but no lipohemarthrosis. Mild soft tissue edema. IMPRESSION: 1. No fracture or dislocation. 2. Mild tricompartmental osteoarthritis with small joint effusion. 3. Osteopenia/osteoporosis. Electronically Signed   By: Keith Rake M.D.   On: 09/21/2021 16:51   DG Hip Unilat W or Wo Pelvis 2-3 Views Right  Result Date: 09/21/2021 CLINICAL DATA:  Fall, right hip pain EXAM: DG HIP (WITH OR WITHOUT PELVIS) 2-3V RIGHT COMPARISON:  None. FINDINGS: There is no  evidence of hip fracture or dislocation. There is no evidence of arthropathy or other focal bone abnormality. IMPRESSION: Negative. Electronically Signed   By: Fidela Salisbury M.D.   On: 09/21/2021 12:17     Labs:   Basic Metabolic Panel: Recent Labs  Lab 09/21/21 1206 09/22/21 0638 09/23/21 0523  NA 141 141 141  K 3.8 3.6 3.4*  CL 108 108 108  CO2 24 22 25   GLUCOSE 120* 107* 90  BUN 20 26* 28*  CREATININE 1.25* 1.19* 1.11*  CALCIUM 8.6* 8.2* 8.1*  MG  --   --  2.0   GFR Estimated Creatinine Clearance: 26.9 mL/min  (A) (by C-G formula based on SCr of 1.11 mg/dL (H)). Liver Function Tests: Recent Labs  Lab 09/21/21 1206  AST 19  ALT 13  ALKPHOS 53  BILITOT 1.8*  PROT 6.8  ALBUMIN 3.1*   No results for input(s): LIPASE, AMYLASE in the last 168 hours. No results for input(s): AMMONIA in the last 168 hours. Coagulation profile No results for input(s): INR, PROTIME in the last 168 hours.  CBC: Recent Labs  Lab 09/21/21 1206 09/22/21 0638 09/23/21 0523  WBC 9.1 9.6 6.4  HGB 14.1 13.3 12.9  HCT 42.9 38.8 39.2  MCV 99.5 99.5 100.8*  PLT 100* 122* 121*   Cardiac Enzymes: No results for input(s): CKTOTAL, CKMB, CKMBINDEX, TROPONINI in the last 168 hours. BNP: Invalid input(s): POCBNP CBG: No results for input(s): GLUCAP in the last 168 hours. D-Dimer No results for input(s): DDIMER in the last 72 hours. Hgb A1c No results for input(s): HGBA1C in the last 72 hours. Lipid Profile No results for input(s): CHOL, HDL, LDLCALC, TRIG, CHOLHDL, LDLDIRECT in the last 72 hours. Thyroid function studies Recent Labs    09/21/21 2031  TSH 2.204   Anemia work up Recent Labs    09/21/21 2031  VITAMINB12 130*   Microbiology Recent Results (from the past 240 hour(s))  Resp Panel by RT-PCR (Flu A&B, Covid) Nasopharyngeal Swab     Status: None   Collection Time: 09/21/21  4:09 PM   Specimen: Nasopharyngeal Swab; Nasopharyngeal(NP) swabs in vial transport medium  Result Value Ref Range Status   SARS Coronavirus 2 by RT PCR NEGATIVE NEGATIVE Final    Comment: (NOTE) SARS-CoV-2 target nucleic acids are NOT DETECTED.  The SARS-CoV-2 RNA is generally detectable in upper respiratory specimens during the acute phase of infection. The lowest concentration of SARS-CoV-2 viral copies this assay can detect is 138 copies/mL. A negative result does not preclude SARS-Cov-2 infection and should not be used as the sole basis for treatment or other patient management decisions. A negative result may occur  with  improper specimen collection/handling, submission of specimen other than nasopharyngeal swab, presence of viral mutation(s) within the areas targeted by this assay, and inadequate number of viral copies(<138 copies/mL). A negative result must be combined with clinical observations, patient history, and epidemiological information. The expected result is Negative.  Fact Sheet for Patients:  EntrepreneurPulse.com.au  Fact Sheet for Healthcare Providers:  IncredibleEmployment.be  This test is no t yet approved or cleared by the [place] FDA and  has been authorized for detection and/or diagnosis of SARS-CoV-2 by FDA under an Emergency Use Authorization (EUA). This EUA will remain  in effect (meaning this test can be used) for the duration of the COVID-19 declaration under Section 564(b)(1) of the Act, 21 U.S.C.section 360bbb-3(b)(1), unless the authorization is terminated  or revoked sooner.       Influenza  A by PCR NEGATIVE NEGATIVE Final   Influenza B by PCR NEGATIVE NEGATIVE Final    Comment: (NOTE) The Xpert Xpress SARS-CoV-2/FLU/RSV plus assay is intended as an aid in the diagnosis of influenza from Nasopharyngeal swab specimens and should not be used as a sole basis for treatment. Nasal washings and aspirates are unacceptable for Xpert Xpress SARS-CoV-2/FLU/RSV testing.  Fact Sheet for Patients: EntrepreneurPulse.com.au  Fact Sheet for Healthcare Providers: IncredibleEmployment.be  This test is not yet approved or cleared by the [place] FDA and has been authorized for detection and/or diagnosis of SARS-CoV-2 by FDA under an Emergency Use Authorization (EUA). This EUA will remain in effect (meaning this test can be used) for the duration of the COVID-19 declaration under Section 564(b)(1) of the Act, 21 U.S.C. section 360bbb-3(b)(1), unless the authorization is terminated  or revoked.  Performed at [facility], [place], [place] [address]      Discharge Instructions:   Discharge Instructions     Diet general   Complete by: As directed    Discharge instructions   Complete by: As directed    Follow-up with your care provider as outpatient in 1 week.   Increase activity slowly   Complete by: As directed       Allergies as of 09/23/2021       Reactions   Codeine Nausea And Vomiting   Morphine And Related Nausea And Vomiting        Medication List     TAKE these medications    doxycycline 100 MG tablet Commonly known as: VIBRA-TABS Take 1 tablet (100 mg total) by mouth 2 (two) times daily for 4 days.   lidocaine 5 % Commonly known as: LIDODERM Place 2 patches onto the skin daily for 5 days. Remove & Discard patch within 12 hours or as directed by MD areas of hip pain   vitamin B-12 1000 MCG tablet Commonly known as: CYANOCOBALAMIN Take 1 tablet (1,000 mcg total) by mouth daily.               Durable Medical Equipment  (From admission, onward)           Start     Ordered   09/22/21 1346  For home use only DME 3 n 1  Once        09/22/21 1345   09/22/21 1343  For home use only DME Hospital bed  Once       Question Answer Comment  Length of Need Lifetime   The above medical condition requires: Patient requires the ability to reposition frequently   Bed type Semi-electric      09/22/21 1343            Follow-up Information     Kathleen Maltese, MD Follow up in 1 week(s).   Specialty: Internal Medicine Contact information: [place] [address] [phone]                  Time coordinating discharge: 39 minutes  Signed:  Ayjah Show  [facility] 09/23/2021, 8:29 AM

## 2021-09-23 NOTE — Discharge Instructions (Signed)
[facility] will be contacting you to set up an appointment to come out for initial evaluation

## 2021-10-26 ENCOUNTER — Ambulatory Visit: Payer: Medicare Other | Admitting: Podiatry

## 2022-09-30 DEATH — deceased
